# Patient Record
Sex: Male | Born: 1935
Health system: Southern US, Community
[De-identification: ages and names within clinical notes are randomized; demographics above are authoritative.]

## PROBLEM LIST (undated history)

## (undated) DIAGNOSIS — E119 Type 2 diabetes mellitus without complications: Secondary | ICD-10-CM

## (undated) DIAGNOSIS — E785 Hyperlipidemia, unspecified: Secondary | ICD-10-CM

## (undated) DIAGNOSIS — N289 Disorder of kidney and ureter, unspecified: Secondary | ICD-10-CM

## (undated) DIAGNOSIS — K219 Gastro-esophageal reflux disease without esophagitis: Secondary | ICD-10-CM

## (undated) DIAGNOSIS — I10 Essential (primary) hypertension: Secondary | ICD-10-CM

## (undated) DIAGNOSIS — I4891 Unspecified atrial fibrillation: Secondary | ICD-10-CM

## (undated) DIAGNOSIS — E039 Hypothyroidism, unspecified: Secondary | ICD-10-CM

## (undated) HISTORY — DX: Hypothyroidism, unspecified: E03.9

## (undated) HISTORY — DX: Hyperlipidemia, unspecified: E78.5

## (undated) HISTORY — PX: KIDNEY STONE SURGERY: SHX686

## (undated) HISTORY — DX: Essential (primary) hypertension: I10

## (undated) HISTORY — DX: Unspecified atrial fibrillation: I48.91

## (undated) HISTORY — DX: Type 2 diabetes mellitus without complications: E11.9

---

## 2005-03-10 ENCOUNTER — Encounter: Admission: RE | Admit: 2005-03-10 | Discharge: 2005-03-10 | Payer: Self-pay | Admitting: Internal Medicine

## 2005-03-29 ENCOUNTER — Encounter: Admission: RE | Admit: 2005-03-29 | Discharge: 2005-03-29 | Payer: Self-pay | Admitting: Internal Medicine

## 2005-04-05 ENCOUNTER — Encounter: Admission: RE | Admit: 2005-04-05 | Discharge: 2005-04-05 | Payer: Self-pay | Admitting: Internal Medicine

## 2005-08-22 ENCOUNTER — Encounter: Admission: RE | Admit: 2005-08-22 | Discharge: 2005-08-22 | Payer: Self-pay | Admitting: Internal Medicine

## 2006-04-22 ENCOUNTER — Encounter: Admission: RE | Admit: 2006-04-22 | Discharge: 2006-04-22 | Payer: Self-pay | Admitting: Internal Medicine

## 2007-09-28 ENCOUNTER — Inpatient Hospital Stay (HOSPITAL_COMMUNITY): Admission: EM | Admit: 2007-09-28 | Discharge: 2007-09-30 | Payer: Self-pay | Admitting: Emergency Medicine

## 2009-12-30 LAB — PROTIME-INR

## 2010-07-10 HISTORY — PX: FRACTURE SURGERY: SHX138

## 2010-07-27 ENCOUNTER — Encounter: Payer: Self-pay | Admitting: Cardiovascular Disease

## 2010-07-27 DIAGNOSIS — I10 Essential (primary) hypertension: Secondary | ICD-10-CM | POA: Insufficient documentation

## 2010-07-27 DIAGNOSIS — E119 Type 2 diabetes mellitus without complications: Secondary | ICD-10-CM | POA: Insufficient documentation

## 2010-07-27 DIAGNOSIS — E785 Hyperlipidemia, unspecified: Secondary | ICD-10-CM | POA: Insufficient documentation

## 2010-07-27 DIAGNOSIS — I4891 Unspecified atrial fibrillation: Secondary | ICD-10-CM | POA: Insufficient documentation

## 2010-07-27 DIAGNOSIS — E039 Hypothyroidism, unspecified: Secondary | ICD-10-CM | POA: Insufficient documentation

## 2010-08-17 ENCOUNTER — Ambulatory Visit (INDEPENDENT_AMBULATORY_CARE_PROVIDER_SITE_OTHER): Payer: Medicare Other | Admitting: Cardiovascular Disease

## 2010-08-17 ENCOUNTER — Encounter: Payer: Self-pay | Admitting: Cardiovascular Disease

## 2010-08-17 DIAGNOSIS — I10 Essential (primary) hypertension: Secondary | ICD-10-CM

## 2010-08-17 DIAGNOSIS — I4891 Unspecified atrial fibrillation: Secondary | ICD-10-CM

## 2010-08-17 DIAGNOSIS — E039 Hypothyroidism, unspecified: Secondary | ICD-10-CM

## 2010-08-17 NOTE — Progress Notes (Signed)
Patient Active Problem List  Diagnoses  . AF (atrial fibrillation)  . DM (diabetes mellitus)  . Hypertension  . Hyperlipemia  . Hypothyroidism    Current Outpatient Prescriptions on File Prior to Visit  Medication Sig Dispense Refill  . aspirin 325 MG tablet Take 325 mg by mouth daily.        . Dabigatran Etexilate Mesylate (PRADAXA) 150 MG CAPS Take 150 mg by mouth every 12 (twelve) hours.       Marland Kitchen diltiazem (TIAZAC) 240 MG 24 hr capsule Take 240 mg by mouth daily.        . fenofibrate (TRICOR) 48 MG tablet Take 48 mg by mouth daily.        Marland Kitchen glipiZIDE (GLUCOTROL) 10 MG 24 hr tablet Take 10 mg by mouth daily.        Marland Kitchen levothyroxine (SYNTHROID, LEVOTHROID) 75 MCG tablet Take 75 mcg by mouth daily.        Marland Kitchen lisinopril (PRINIVIL,ZESTRIL) 40 MG tablet Take 40 mg by mouth daily.        Marland Kitchen losartan-hydrochlorothiazide (HYZAAR) 100-25 MG per tablet Take 1 tablet by mouth daily.        . Multiple Vitamins-Minerals (CENTRUM SILVER PO) Take by mouth daily.        . pravastatin (PRAVACHOL) 10 MG tablet Take 10 mg by mouth daily.        . Tamsulosin HCl (FLOMAX) 0.4 MG CAPS Take 0.4 mg by mouth daily.          Allergies  Allergen Reactions  . Penicillins Hives    Subjective: Nicolas Snyder presents today for followup of his atrial fibrillation. He complains of being fatigued and not having any appetite. He's lost quite a bit of weight. He was also found have heme-positive stools and was seen by Dr. Osborne Casco.He denies any chest pain or shortness of breath.  Physical exam:  Weight is 173 pounds.  The blood pressure is 142/60.  The heart rate is 72.  The HEENT exam reveals that the sclera are nonicteric.  The mucous membranes are moist.  There are 2+ carotids without bruits.  There is no thyromegaly.  There is no JVD.  The lungs are clear.  .  The heart exam reveals an irregular rate with a normal S1 and S2.  There are no murmurs, gallops, or rubs.  The PMI is not displaced.  The chest wall is non tender.   Abdominal exam reveals good bowel sounds.  There is no guarding or rebound.  There is no hepatosplenomegaly or tenderness.  There are no masses.  Exam of the legs reveal is no clubbing, cyanosis, or edema.  The legs are without rashes.  The distal pulses are intact.  Cranial nerves II - XII are intact.  Motor and sensory functions are intact.  The gait is normal.  A TSH was drawn and is pending.

## 2010-08-17 NOTE — Assessment & Plan Note (Addendum)
His atrial fibrillation is well controlled. He is tolerating the Pradaxa quite well. We will continue with her same medications.I'll see him again in 6 months for follow up visit

## 2010-08-17 NOTE — Assessment & Plan Note (Signed)
His blood pressure is fairly well-controlled although is borderline high today. We'll continue to keep a close eye on this.

## 2010-08-17 NOTE — Assessment & Plan Note (Signed)
He presents with generalized fatigue and weight loss. We'll check a TSH, free T3, free T4 today. We will forward these results to Dr. Osborne Casco.

## 2010-08-18 ENCOUNTER — Other Ambulatory Visit (INDEPENDENT_AMBULATORY_CARE_PROVIDER_SITE_OTHER): Payer: Medicare Other

## 2010-08-18 DIAGNOSIS — I10 Essential (primary) hypertension: Secondary | ICD-10-CM

## 2010-08-18 DIAGNOSIS — E039 Hypothyroidism, unspecified: Secondary | ICD-10-CM

## 2010-08-18 DIAGNOSIS — I4891 Unspecified atrial fibrillation: Secondary | ICD-10-CM

## 2010-09-25 NOTE — Progress Notes (Signed)
Do we need to schedule a tsh for mr. Nicolas Snyder

## 2010-10-04 ENCOUNTER — Emergency Department (HOSPITAL_COMMUNITY): Payer: Medicare Other

## 2010-10-04 ENCOUNTER — Inpatient Hospital Stay (HOSPITAL_COMMUNITY)
Admission: EM | Admit: 2010-10-04 | Discharge: 2010-10-10 | DRG: 480 | Disposition: A | Discharge status: Skilled Nursing Facility | Payer: Medicare Other | Attending: Internal Medicine | Admitting: Internal Medicine

## 2010-10-04 DIAGNOSIS — E876 Hypokalemia: Secondary | ICD-10-CM | POA: Diagnosis not present

## 2010-10-04 DIAGNOSIS — K254 Chronic or unspecified gastric ulcer with hemorrhage: Secondary | ICD-10-CM | POA: Diagnosis present

## 2010-10-04 DIAGNOSIS — T148XXA Other injury of unspecified body region, initial encounter: Secondary | ICD-10-CM

## 2010-10-04 DIAGNOSIS — K264 Chronic or unspecified duodenal ulcer with hemorrhage: Secondary | ICD-10-CM | POA: Diagnosis present

## 2010-10-04 DIAGNOSIS — Z860100 Personal history of colon polyps, unspecified: Secondary | ICD-10-CM

## 2010-10-04 DIAGNOSIS — R791 Abnormal coagulation profile: Secondary | ICD-10-CM | POA: Diagnosis present

## 2010-10-04 DIAGNOSIS — I4891 Unspecified atrial fibrillation: Secondary | ICD-10-CM | POA: Diagnosis present

## 2010-10-04 DIAGNOSIS — W010XXA Fall on same level from slipping, tripping and stumbling without subsequent striking against object, initial encounter: Secondary | ICD-10-CM | POA: Diagnosis present

## 2010-10-04 DIAGNOSIS — S72143A Displaced intertrochanteric fracture of unspecified femur, initial encounter for closed fracture: Principal | ICD-10-CM | POA: Diagnosis present

## 2010-10-04 DIAGNOSIS — N471 Phimosis: Secondary | ICD-10-CM | POA: Diagnosis present

## 2010-10-04 DIAGNOSIS — E785 Hyperlipidemia, unspecified: Secondary | ICD-10-CM | POA: Diagnosis present

## 2010-10-04 DIAGNOSIS — Y92009 Unspecified place in unspecified non-institutional (private) residence as the place of occurrence of the external cause: Secondary | ICD-10-CM

## 2010-10-04 DIAGNOSIS — D62 Acute posthemorrhagic anemia: Secondary | ICD-10-CM | POA: Diagnosis not present

## 2010-10-04 DIAGNOSIS — T45515A Adverse effect of anticoagulants, initial encounter: Secondary | ICD-10-CM | POA: Diagnosis present

## 2010-10-04 DIAGNOSIS — I129 Hypertensive chronic kidney disease with stage 1 through stage 4 chronic kidney disease, or unspecified chronic kidney disease: Secondary | ICD-10-CM | POA: Diagnosis present

## 2010-10-04 DIAGNOSIS — E119 Type 2 diabetes mellitus without complications: Secondary | ICD-10-CM | POA: Diagnosis present

## 2010-10-04 DIAGNOSIS — N183 Chronic kidney disease, stage 3 unspecified: Secondary | ICD-10-CM | POA: Diagnosis present

## 2010-10-04 LAB — APTT: aPTT: 57 seconds — ABNORMAL HIGH (ref 24–37)

## 2010-10-04 LAB — COMPREHENSIVE METABOLIC PANEL
ALT: 12 U/L (ref 0–53)
AST: 14 U/L (ref 0–37)
Albumin: 3.4 g/dL — ABNORMAL LOW (ref 3.5–5.2)
Alkaline Phosphatase: 20 U/L — ABNORMAL LOW (ref 39–117)
CO2: 28 mEq/L (ref 19–32)
Chloride: 101 mEq/L (ref 96–112)
Creatinine, Ser: 2.44 mg/dL — ABNORMAL HIGH (ref 0.4–1.5)
GFR calc Af Amer: 31 mL/min — ABNORMAL LOW (ref >60)
GFR calc non Af Amer: 25 mL/min — ABNORMAL LOW (ref >60)
Potassium: 3.4 mEq/L — ABNORMAL LOW (ref 3.5–5.1)
Sodium: 139 mEq/L (ref 135–145)
Total Bilirubin: 0.7 mg/dL (ref 0.3–1.2)

## 2010-10-04 LAB — DIFFERENTIAL
Basophils Relative: 0 % (ref 0–1)
Eosinophils Relative: 1 % (ref 0–5)
Lymphs Abs: 0.7 10*3/uL (ref 0.7–4.0)
Monocytes Absolute: 0.7 10*3/uL (ref 0.1–1.0)
Monocytes Relative: 4 % (ref 3–12)

## 2010-10-04 LAB — URINALYSIS, ROUTINE W REFLEX MICROSCOPIC
Nitrite: NEGATIVE
Specific Gravity, Urine: 1.019 (ref 1.005–1.030)
Urobilinogen, UA: 0.2 mg/dL (ref 0.0–1.0)
pH: 5.5 (ref 5.0–8.0)

## 2010-10-04 LAB — CBC
Platelets: 236 10*3/uL (ref 150–400)
RBC: 3.38 MIL/uL — ABNORMAL LOW (ref 4.22–5.81)
RDW: 13 % (ref 11.5–15.5)
WBC: 16.9 10*3/uL — ABNORMAL HIGH (ref 4.0–10.5)

## 2010-10-04 LAB — SAMPLE TO BLOOD BANK

## 2010-10-04 LAB — OCCULT BLOOD, POC DEVICE: Fecal Occult Bld: POSITIVE

## 2010-10-05 ENCOUNTER — Inpatient Hospital Stay (HOSPITAL_COMMUNITY): Payer: Medicare Other

## 2010-10-05 LAB — GLUCOSE, CAPILLARY
Glucose-Capillary: 170 mg/dL — ABNORMAL HIGH (ref 70–99)
Glucose-Capillary: 97 mg/dL (ref 70–99)
Glucose-Capillary: 103 mg/dL — ABNORMAL HIGH (ref 70–99)

## 2010-10-05 LAB — BASIC METABOLIC PANEL
BUN: 82 mg/dL — ABNORMAL HIGH (ref 6–23)
Chloride: 104 mEq/L (ref 96–112)
Chloride: 106 mEq/L (ref 96–112)
GFR calc Af Amer: 32 mL/min — ABNORMAL LOW (ref >60)
GFR calc non Af Amer: 27 mL/min — ABNORMAL LOW (ref >60)
Glucose, Bld: 134 mg/dL — ABNORMAL HIGH (ref 70–99)
Potassium: 3.4 mEq/L — ABNORMAL LOW (ref 3.5–5.1)
Potassium: 3.5 mEq/L (ref 3.5–5.1)
Sodium: 141 mEq/L (ref 135–145)

## 2010-10-05 LAB — CBC
HCT: 20.7 % — ABNORMAL LOW (ref 39.0–52.0)
HCT: 26.2 % — ABNORMAL LOW (ref 39.0–52.0)
Hemoglobin: 6.7 g/dL — CL (ref 13.0–17.0)
Hemoglobin: 8.5 g/dL — ABNORMAL LOW (ref 13.0–17.0)
MCH: 29.6 pg (ref 26.0–34.0)
MCH: 30.4 pg (ref 26.0–34.0)
MCH: 30.5 pg (ref 26.0–34.0)
MCHC: 32.4 g/dL (ref 30.0–36.0)
MCHC: 33.6 g/dL (ref 30.0–36.0)
MCV: 89.4 fL (ref 78.0–100.0)
Platelets: 197 10*3/uL (ref 150–400)
RBC: 2.93 MIL/uL — ABNORMAL LOW (ref 4.22–5.81)
RDW: 13.1 % (ref 11.5–15.5)
RDW: 14.5 % (ref 11.5–15.5)
WBC: 10.3 10*3/uL (ref 4.0–10.5)

## 2010-10-05 LAB — DIFFERENTIAL
Basophils Absolute: 0 10*3/uL (ref 0.0–0.1)
Eosinophils Absolute: 0 10*3/uL (ref 0.0–0.7)
Lymphs Abs: 0.8 10*3/uL (ref 0.7–4.0)
Monocytes Absolute: 0.8 10*3/uL (ref 0.1–1.0)
Neutro Abs: 12.3 10*3/uL — ABNORMAL HIGH (ref 1.7–7.7)

## 2010-10-05 LAB — APTT: aPTT: 68 seconds — ABNORMAL HIGH (ref 24–37)

## 2010-10-05 LAB — CARDIAC PANEL(CRET KIN+CKTOT+MB+TROPI)
CK, MB: 1.2 ng/mL (ref 0.3–4.0)
CK, MB: 1.4 ng/mL (ref 0.3–4.0)
Relative Index: INVALID (ref 0.0–2.5)
Troponin I: 0.02 ng/mL (ref 0.00–0.06)

## 2010-10-05 LAB — URINE CULTURE

## 2010-10-05 LAB — HEMOGLOBIN A1C: Hgb A1c MFr Bld: 6.8 % — ABNORMAL HIGH (ref <5.7)

## 2010-10-06 LAB — GLUCOSE, CAPILLARY: Glucose-Capillary: 146 mg/dL — ABNORMAL HIGH (ref 70–99)

## 2010-10-06 LAB — CBC
HCT: 25.7 % — ABNORMAL LOW (ref 39.0–52.0)
HCT: 25.8 % — ABNORMAL LOW (ref 39.0–52.0)
Hemoglobin: 8.6 g/dL — ABNORMAL LOW (ref 13.0–17.0)
Hemoglobin: 8.7 g/dL — ABNORMAL LOW (ref 13.0–17.0)
RBC: 2.87 MIL/uL — ABNORMAL LOW (ref 4.22–5.81)
RDW: 14.6 % (ref 11.5–15.5)
WBC: 10.4 10*3/uL (ref 4.0–10.5)
WBC: 10.5 10*3/uL (ref 4.0–10.5)

## 2010-10-06 LAB — BRAIN NATRIURETIC PEPTIDE: Pro B Natriuretic peptide (BNP): 125 pg/mL — ABNORMAL HIGH (ref 0.0–100.0)

## 2010-10-06 LAB — PROTIME-INR: INR: 1.53 — ABNORMAL HIGH (ref 0.00–1.49)

## 2010-10-06 LAB — BASIC METABOLIC PANEL
BUN: 68 mg/dL — ABNORMAL HIGH (ref 6–23)
GFR calc non Af Amer: 27 mL/min — ABNORMAL LOW (ref >60)
Potassium: 3.8 mEq/L (ref 3.5–5.1)

## 2010-10-06 LAB — PREPARE FRESH FROZEN PLASMA
Unit division: 0
Unit division: 0

## 2010-10-06 LAB — HEMOGLOBIN AND HEMATOCRIT, BLOOD
HCT: 26.7 % — ABNORMAL LOW (ref 39.0–52.0)
Hemoglobin: 8.7 g/dL — ABNORMAL LOW (ref 13.0–17.0)

## 2010-10-06 LAB — APTT: aPTT: 64 seconds — ABNORMAL HIGH (ref 24–37)

## 2010-10-07 ENCOUNTER — Inpatient Hospital Stay (HOSPITAL_COMMUNITY): Payer: Medicare Other

## 2010-10-07 DIAGNOSIS — I4891 Unspecified atrial fibrillation: Secondary | ICD-10-CM

## 2010-10-07 LAB — PREPARE RBC (CROSSMATCH)

## 2010-10-07 LAB — URINALYSIS, ROUTINE W REFLEX MICROSCOPIC
Protein, ur: NEGATIVE mg/dL
Urobilinogen, UA: 0.2 mg/dL (ref 0.0–1.0)

## 2010-10-07 LAB — BASIC METABOLIC PANEL
BUN: 51 mg/dL — ABNORMAL HIGH (ref 6–23)
CO2: 29 mEq/L (ref 19–32)
Chloride: 108 mEq/L (ref 96–112)
Glucose, Bld: 128 mg/dL — ABNORMAL HIGH (ref 70–99)
Potassium: 3.7 mEq/L (ref 3.5–5.1)

## 2010-10-07 LAB — GLUCOSE, CAPILLARY
Glucose-Capillary: 138 mg/dL — ABNORMAL HIGH (ref 70–99)
Glucose-Capillary: 145 mg/dL — ABNORMAL HIGH (ref 70–99)

## 2010-10-07 LAB — PROTIME-INR
INR: 1.36 (ref 0.00–1.49)
Prothrombin Time: 17 seconds — ABNORMAL HIGH (ref 11.6–15.2)

## 2010-10-07 LAB — CBC
MCV: 91.3 fL (ref 78.0–100.0)
Platelets: 156 10*3/uL (ref 150–400)
RBC: 3 MIL/uL — ABNORMAL LOW (ref 4.22–5.81)
WBC: 10.2 10*3/uL (ref 4.0–10.5)

## 2010-10-07 LAB — APTT: aPTT: 67 seconds — ABNORMAL HIGH (ref 24–37)

## 2010-10-07 LAB — URINE MICROSCOPIC-ADD ON

## 2010-10-08 LAB — CROSSMATCH
ABO/RH(D): A POS
Unit division: 0

## 2010-10-08 LAB — GLUCOSE, CAPILLARY
Glucose-Capillary: 136 mg/dL — ABNORMAL HIGH (ref 70–99)
Glucose-Capillary: 143 mg/dL — ABNORMAL HIGH (ref 70–99)
Glucose-Capillary: 147 mg/dL — ABNORMAL HIGH (ref 70–99)
Glucose-Capillary: 148 mg/dL — ABNORMAL HIGH (ref 70–99)

## 2010-10-08 LAB — BASIC METABOLIC PANEL
CO2: 26 mEq/L (ref 19–32)
Chloride: 110 mEq/L (ref 96–112)
GFR calc Af Amer: 38 mL/min — ABNORMAL LOW (ref >60)
Potassium: 3.9 mEq/L (ref 3.5–5.1)
Sodium: 144 mEq/L (ref 135–145)

## 2010-10-08 LAB — CBC
Hemoglobin: 8.3 g/dL — ABNORMAL LOW (ref 13.0–17.0)
MCH: 29.6 pg (ref 26.0–34.0)
RBC: 2.8 MIL/uL — ABNORMAL LOW (ref 4.22–5.81)
WBC: 10.2 10*3/uL (ref 4.0–10.5)

## 2010-10-08 NOTE — H&P (Signed)
NAME:  Nicolas Snyder, Nicolas Snyder NO.:  [id number]  MEDICAL RECORD NO.:  [id number]           PATIENT TYPE:  I  LOCATION:  [id number]                         FACILITY:  [facility]  PHYSICIAN:  [name], MD       DATE OF BIRTH:  [birth year]  DATE OF ADMISSION:  10/04/2010 DATE OF DISCHARGE:                             HISTORY & PHYSICAL   PRIMARY CARE PROVIDER:  [name], M.D.  GASTROENTEROLOGIST:  Earle Gell, M.D.  CARDIOLOGIST:  Thayer Headings, M.D.  CHIEF COMPLAINT:  Left hip fracture and multiple medical problems.  HISTORY OF PRESENT ILLNESS:  An 77 year old male with multiple medical problems who was making his wife's food tonight when he moved around the table and fell and broke his left hip.  He had a syncopal event and question whether he passed out.  He did hit his head.  He hit his left elbow.  He felt bad all day.  He had anorexia, did not eat breakfast or lunch, had Ensure at about 3 p.m.  He describes recent positive abdominal pain, recent rectal bleeding.  Dr. Osborne Casco wanted him to have a 4th colonoscopy where he has had a history of multiple polyps in the past.  Two other colonoscopies were done in [place], [place], and one has been done here with Dr. Wynetta Emery and one of the things he reports is having about 11 polyps.  Because he could not move and get up, EMS was called, he was brought to the emergency department.  In the ambulance, he vomited coffee-ground emesis.  In the ER, workup showed the left hip fracture and I was called for inpatient admission.  He has been given IV Zofran, fentanyl, immobilization, and fluids.  Of note, the patient does take care of his wife with Alzheimer disease.  PAST MEDICAL HISTORY: 1. AFib, on Pradaxa. 2. Hypertension. 3. Diabetes mellitus type 2. 4. Nephrolithiasis, status post open removal of stones in 2005. 5. History of partial small bowel obstruction in May 2009, resolved     with conservative treatment. 6.  Hypothyroidism. 7. Left cataract repair and blindness in left eye. 8. Chronic kidney disease with baseline creatinine that I found in     2009 is 1.8. 9. History of multiple polyps.  MEDICATIONS: 1. Pradaxa for at least a year and a half. 2. Glipizide 10 daily. 3. Synthroid 0.075 daily. 4. Diltiazem 240 daily. 5. Pravastatin 10 daily. 6. Losartan HCT 125 daily. 7. Lisinopril 40 daily. 8. TriCor 48 daily. 9. Flomax 0.4 daily. 10.Aspirin 81 daily.  FAMILY HISTORY:  Stroke and pancreatic cancer.  SOCIAL HISTORY:  Married.  Retired Firefighter.  Quit tobacco in 1961.  REVIEW OF SYSTEMS:  No current dizziness, chest pain, shortness of breath.  Full review of systems was obtained and negative.  The patient is a very good historian and does not remember any precipitating event, really does not remember the event itself.  PHYSICAL EXAMINATION:  VITAL SIGNS:  Temperature 97.8, heart rate 87, blood pressure 136/78, saturating 96% on room air. GENERAL:  Alert and oriented.  He smells of a GI bleed.  PULMONARY:  Clear to auscultation bilaterally. CARDIAC:  Irregularly irregular. RECTAL:  Heme positive. ABDOMEN:  Soft, nontender, nondistended.  Bowel sounds are positive. Oropharynx is dry. NECK:  No JVD.  ANCILLARY DATA:  Cranial CT shows mild atrophy, nothing acute.  CT spine shows osteopenia without fractures, severe multilevel osteoarthritis. Left hip x-ray shows comminuted left intertrochanteric femur fracture. Chest x-ray shows cardiomegaly without interstitial changes.  The occult blood was positive.  White count 16.9, platelet count 236, hemoglobin 10.1, PTT 57, INR 2.53. Urinalysis is negative.  Sodium 139, potassium 3.4, chloride 101, bicarb 28, BUN 70, creatinine 2.44, albumin 3.4.  EKG shows AFib, heart rate 87.  ASSESSMENT:  This is an elderly man with multiple medical problems, being admitted to Dr. Loren Racer service for syncope, GI bleed, and left hip  fracture.  PLAN: 1. Step down for supportive care, seial Hemoglobins, and CK/TI's. 2. INR may be a false positive, but with the GI bleed while on the Pradaxa, we will give     FFP and vitamin K. 3. We will consult GI.  I have already talked with Dr. Paulita Fujita who is     on call for Dr. Wynetta Emery and, if needed, bring him in tonight for     the EGD, but with coffee-ground emesis and no hematemesis, can be     set up for the morning.  We will need to do an EGD. 4. Low threshold for NG tube, but at this point since he is     hemodynamically stable and not vomiting bright red blood, I do not     see the benefit of it, but will keep it in mind. 5. Hold aspirin and Pradaxa at this current time and hold most of the     oral medications at this point. 6. IV fluids, but watch for volume overload. 7. Renal insufficiency.  He is off his baseline.  His BUN is elevated,     probably from the GI bleed, though. 8. Cardiology has been consulted. 9. Ortho will be consulted. 10.Sip and chips only for now and n.p.o. post midnight except for     meds. 11.We will try to keep rate controlled. 12.Squeezers for DVT prophylaxis. 13.Pain control. 14.Blood pressure is currently fine. 15.After GI and cardiology workup, he will need to go for an ORIF     surgery. 16.We will check TSH and A1c. 17.Insulin sliding scale. 18.He can easily get orthostatic and syncopal due to dehydration from     his multiple medications and the worsening renal failure and the     medications which could have led to his syncope on top on the GI     bleed include diltiazem, the losartan HCT, and the lisinopril on     top of the Flomax. These have all been stopped at this current time     and need to rethought about as he improves.     [name], MD     JMR/MEDQ  D:  10/04/2010  T:  10/05/2010  Job:  [id number]  Electronically Signed by Shon Baton MD on 10/08/2010 12:50:40 PM

## 2010-10-09 LAB — GLUCOSE, CAPILLARY
Glucose-Capillary: 123 mg/dL — ABNORMAL HIGH (ref 70–99)
Glucose-Capillary: 136 mg/dL — ABNORMAL HIGH (ref 70–99)
Glucose-Capillary: 148 mg/dL — ABNORMAL HIGH (ref 70–99)
Glucose-Capillary: 155 mg/dL — ABNORMAL HIGH (ref 70–99)

## 2010-10-09 LAB — BASIC METABOLIC PANEL
CO2: 27 mEq/L (ref 19–32)
Calcium: 8.3 mg/dL — ABNORMAL LOW (ref 8.4–10.5)
Chloride: 110 mEq/L (ref 96–112)
GFR calc Af Amer: 35 mL/min — ABNORMAL LOW (ref >60)
Sodium: 142 mEq/L (ref 135–145)

## 2010-10-09 LAB — CBC
Hemoglobin: 8.2 g/dL — ABNORMAL LOW (ref 13.0–17.0)
MCHC: 32 g/dL (ref 30.0–36.0)
Platelets: 223 10*3/uL (ref 150–400)
RBC: 2.76 MIL/uL — ABNORMAL LOW (ref 4.22–5.81)

## 2010-10-10 LAB — CBC
HCT: 24.4 % — ABNORMAL LOW (ref 39.0–52.0)
Hemoglobin: 7.9 g/dL — ABNORMAL LOW (ref 13.0–17.0)
MCH: 29.8 pg (ref 26.0–34.0)
MCHC: 32.4 g/dL (ref 30.0–36.0)
MCV: 92.1 fL (ref 78.0–100.0)
Platelets: 236 K/uL (ref 150–400)
RBC: 2.65 MIL/uL — ABNORMAL LOW (ref 4.22–5.81)
RDW: 14.5 % (ref 11.5–15.5)
WBC: 10.1 K/uL (ref 4.0–10.5)

## 2010-10-10 LAB — GLUCOSE, CAPILLARY
Glucose-Capillary: 141 mg/dL — ABNORMAL HIGH (ref 70–99)
Glucose-Capillary: 141 mg/dL — ABNORMAL HIGH (ref 70–99)
Glucose-Capillary: 116 mg/dL — ABNORMAL HIGH (ref 70–99)
Glucose-Capillary: 140 mg/dL — ABNORMAL HIGH (ref 70–99)
Glucose-Capillary: 153 mg/dL — ABNORMAL HIGH (ref 70–99)
Glucose-Capillary: 160 mg/dL — ABNORMAL HIGH (ref 70–99)

## 2010-10-10 LAB — BASIC METABOLIC PANEL WITH GFR
BUN: 34 mg/dL — ABNORMAL HIGH (ref 6–23)
CO2: 25 meq/L (ref 19–32)
Calcium: 8 mg/dL — ABNORMAL LOW (ref 8.4–10.5)
Chloride: 110 meq/L (ref 96–112)
Creatinine, Ser: 2.1 mg/dL — ABNORMAL HIGH (ref 0.4–1.5)
GFR calc Af Amer: 36 mL/min — ABNORMAL LOW (ref >60)
GFR calc non Af Amer: 30 mL/min — ABNORMAL LOW (ref >60)
Glucose, Bld: 135 mg/dL — ABNORMAL HIGH (ref 70–99)
Potassium: 4 meq/L (ref 3.5–5.1)
Sodium: 140 meq/L (ref 135–145)

## 2010-10-10 LAB — TRANSFUSION REACTION: Post RXN DAT IgG: NEGATIVE

## 2010-10-10 NOTE — Op Note (Signed)
NAME:  Nicolas Snyder, Nicolas Snyder                 ACCOUNT NO.:  [id number]  MEDICAL RECORD NO.:  [id number]           PATIENT TYPE:  I  LOCATION:  [id number]                         [facility]ACILITY:  [facility]  PHYSICIAN:  Pietro Cassis. Alvan Dame, M.D.  DATE OF BIRTH:  [birth year]  DATE OF PROCEDURE:  10/07/2010 DATE OF DISCHARGE:                              OPERATIVE REPORT   PREOPERATIVE DIAGNOSIS:  Comminuted left intertrochanteric femur fracture.  POSTOPERATIVE DIAGNOSIS:  Comminuted left intertrochanteric femur fracture.  PROCEDURE:  Open reduction and internal fixation of left hip fracture utilizing DePuy troch nail 11 x 200 mm with a 115 mm lag screw and distal interlock.  SURGEON:  Pietro Cassis. Alvan Dame, MD.  ASSISTANT:  Surgical team.  ANESTHESIA:  General.  ESTIMATED BLOOD LOSS:  Approximately 100 cc or less.  DRAINS:  None.  COMPLICATIONS:  None.  SPECIMENS:  None.  INDICATIONS FOR PROCEDURE:  Mr. Nicolas Snyder is an 77 year old male with multiple medical problems admitted to the hospital on October 04, 2010, after a fall.  He had complaints of left hip pain and radiographs revealed a left hip fracture.  Once stabilized and cleared from a medical standpoint, he was set up for surgery for open reduction and internal fixation and stabilization of the fracture to allow for mobility and ambulation.  Consent was obtained after reviewing the risks and benefits and the necessity of the operation.  PROCEDURE IN DETAIL:  The patient was brought to the operative theater. Once adequate anesthesia and preoperative antibiotics, clindamycin administered.  He was positioned supine on the fracture table.  His right lower extremity was flexed and abducted out of the way with bony prominences padded.  The left foot was placed into the into the leg holder.  Traction and internal rotation applied.  Once positioned adequately, fluoroscopy was used to confirm fracture reduction.  A time- out was performed identifying the patient,  planned procedure and extremity.  The left lower extremity was then prepped and draped in a sterile fashion from the iliac crest down close to the knee with shower curtain technique.  The fluoroscopy was brought back to the field.  Landmarks identified and incision was made proximal to the trochanter laterally.  Guidewire was then inserted into the tip of the trochanter and then passed in the proximal femur.  Once this was confirmed, the proximal femur was drilled and the nail passed by hand to its appropriate depth.  Once it was then positioned, the guide for the guidewire was positioned and lateral incision made.  Guidewire was then inserted into the center of the femoral head in AP and lateral planes.  I measured the depth of this and chose a 150 mm lag screw and drilled this for the lag screw and then passed the lag screw by hand.  At this point, traction was taken off and compression was applied across the fracture site, medialized in the shaft of the fracture.  At this point, I locked down the proximal screw and then backed off a quarter of a turn to allow for compression.  The distal interlock was placed off the jig measuring 36 mm.  At this point, the jig was removed.  Final radiographs were obtained in AP and lateral planes.  The hip wounds were irrigated and the proximal wound was closed in layers with a #1 Vicryl and gluteal fascia.  The remaining wounds were closed with 2-0 Vicryl and staples on the skin. Skin was cleaned, dried and dressed sterilely using Mepilex dressing. The patient was then awoken from anesthesia, transferred to the hospital bed and brought to the recovery room in stable condition tolerating the procedure well.     Pietro Cassis Alvan Dame, M.D.     MDO/MEDQ  D:  10/07/2010  T:  10/08/2010  Job:  [id number]  Electronically Signed by Paralee Cancel M.D. on 10/10/2010 12:15:45 PM

## 2010-10-11 LAB — CROSSMATCH
ABO/RH(D): A POS
Unit division: 0

## 2010-10-11 NOTE — Discharge Summary (Signed)
NAME:  Nicolas Snyder, Nicolas Snyder                 ACCOUNT NO.:  [id number]  MEDICAL RECORD NO.:  [id number]           PATIENT TYPE:  I  LOCATION:  [id number]                         FACILITY:  [facility]  PHYSICIAN:  Haywood Pao, M.D.DATE OF BIRTH:  1920/09/08  DATE OF ADMISSION:  10/04/2010 DATE OF DISCHARGE:  10/10/2010                              DISCHARGE SUMMARY   FINAL DIAGNOSES: 1. Upper gastrointestinal bleed secondary to ulcer. 2. Atrial fibrillation, chronic Pradaxa, now discontinued secondary to     above. 3. Anemia requiring transfusion. 4. Left hip fracture status post open reduction and internal fixation. 5. Hypertension. 6. Hyperlipidemia. 7. Hypothyroidism. 8. Diabetes mellitus type 2, mild. 9. History of nephrolithiasis. 10.History of cataract repair. 11.Chronic kidney disease, stage III. 12.History of multiple colon polyps.  ALLERGIES:  ASPIRIN and PRADAXA.  DISCHARGE MEDICATIONS: 1. Tylenol 325 mg 1 to 2 tablets every 6 hours as needed for pain. 2. Diltiazem 360 mg p.o. daily.  This is an increase from his 240. 3. Iron sulfate 325 mg p.o. b.i.d. 4. Hydrocodone/APAP 5/325 one to two tablets p.o. q.4 h as needed for     pain. 5. Robaxin 500 mg p.o. q.6 h as needed for pain/spasm. 6. Zofran 4 mg IV every 6 hours as needed for nausea. 7. Protonix 40 mg p.o. daily, to be continued at least 1 month. 8. Flomax 0.4 mg p.o. q.h.s. 9. Glipizide 10 mg p.o. daily. 10.Hyzaar 100/25 one tablet p.o. daily. 11.Multivitamin 1 tablet daily. 12.Pravastatin 10 mg p.o. daily. 13.Synthroid 75 mcg p.o. daily. 14.Fenofibrate 48 mg 1 tablet p.o. daily.  Medications which have been discontinued are: 1. Lisinopril 40 mg daily. 2. Aspirin 81 mg daily. 3. Pradaxa 150 mg twice daily.  His diltiazem dose has been increased from 240 to 360.  DISCHARGE PHYSICAL EXAM:  VITAL SIGNS:  Blood pressure 136/73, pulse 73, respiratory rate 16, satting 92% on room air. GENERAL:  No acute  distress. HEENT:  Normocephalic, atraumatic.  PERRLA, EOMI.  ENT within normal limits. NECK:  Supple.  No lymphadenopathy, JVD or bruit. HEART:  Irregularly irregular consistent with atrial fibrillation. LUNGS:  Clear to auscultation bilaterally. ABDOMEN:  Soft, nontender, normoactive bowel sounds. EXTREMITIES:  The patient has appropriate dressing of the left hip and has a slight decrease range of motion secondary to pain.  He has been able to weightbear on this with limited mobility.  There is no erythema or exudate from the surgical incision.  He has trace edema in his lower extremities which is chronic for him. NEURO:  The patient is oriented to person, place and time and does not have any lateralizing deficits.  SUMMARY OF HOSPITAL COURSE:  [name] is a very unfortunate gentleman of mine who is 77 years old.  He has had some issues with heme-positive stools in the past but never has had an overt anemia with this.  On the date of admission, he stood up and fell over because he got dizzy and came to the emergency room and was found to have a hemoglobin of 6 with active GI bleed.  Due to his fall, he actually fractured  his left hip as well.  The patient was taken off his Pradaxa and given fresh frozen plasma and total of 4 units of packed red blood cells which was able to stabilize his hemoglobin and hematocrit in the mid 8s.  He has remained this way.  Consultation per GI led to an EGD within 24 hours that saw an area of ulceration in the stomach that was 1 cm in size.  They did not perform brushings and no intervention was needed as the area was not actively bleeding and has not actively bled since he has completed his transfusion with FFP.  The patient was then stabilized from a hemodynamic standpoint and underwent hip surgery on the following Friday which was successful with a left open reduction and internal fixation. He has remained in bed for most of the weekend due to pain  but has been able to stand up at the edge of bed with assistance.  The overall plan for him is to go to [facility] to get rehab so that he may eventually return home.  His family is going to be taking care of his wife, who he is normally caretaker of due to her dementia.  I spoke at length with the patient's son and they are happy with the plans for being at [facility] as this is convenient location to both parties.  He has a pending transfer there on the date of his discharge.  FOLLOWUP:  The patient is to follow up with Dr. Alvan Dame at [facility] in 2 weeks.  He is to follow up with me 1 week following from leaving [place].  He will be followed by my partners at that facility.  He needs to undergo physical therapy and rehab to the point where he can return home and once again be care giving for his wife.  Allergies as aspirin and Pradaxa.  So he does not receive any antiplatelet, anticoagulation.  He understands this puts him at high risk because of atrial fibrillation but due to the amount of bleeding he had, he needs to be off these medications for least 1 month with PPI.  We will reinstitute aspirin as his atrial fibrillation prophylaxis once he is fully healed.  CONDITION ON DISCHARGE:  Stable.     Haywood Pao, M.D.     RWT/MEDQ  D:  10/10/2010  T:  10/10/2010  Job:  [id number]  Electronically Signed by Domenick Gong M.D. on 10/11/2010 04:59:41 PM

## 2010-10-11 NOTE — Op Note (Signed)
  NAME:  OVID, TERRY NO.:  [id number]  MEDICAL RECORD NO.:  [id number]           PATIENT TYPE:  I  LOCATION:  [id number]                         FACILITY:  [facility]  PHYSICIAN:  Wonda Horner, M.D.   DATE OF BIRTH:  [birth year]  DATE OF PROCEDURE:  10/05/2010 DATE OF DISCHARGE:                              OPERATIVE REPORT   PROCEDURE:  Esophagogastroduodenoscopy.  INDICATIONS FOR PROCEDURE:  Coffee-ground emesis.  Informed consent was obtained after explanation of the risks of bleeding, infection and perforation.  PREMEDICATIONS: 1. Fentanyl 37.5 mcg IV. 2. Versed 3 mg IV.  DESCRIPTION OF PROCEDURE:  With the patient in the left lateral decubitus position, the Pentax gastroscope was inserted into the oropharynx and passed into the esophagus.  It was advanced down the esophagus, then into the stomach and into the duodenum.  The second portion of the duodenum looked normal.  In the bulb of the duodenum, there was a 1-cm clean based duodenal bulb ulcer.  In the pyloric channel, there was a smaller ulcer as well.  The stomach was otherwise normal including retroflexion of the fundus and cardia.  The esophagus looked normal.  He tolerated the procedure well without complications.  IMPRESSION: 1. A 1-cm duodenal bulb ulcer. 2. Small pyloric channel ulcer.  There is no evidence of active bleeding.  I would recommend avoidance of aspirin and NSAIDS.  We would also recommend a proton pump inhibitor therapy for this patient.  A biopsy for H pylori was not obtained due to coagulopathy.  I would recommend checking an H pylori antibody.          ______________________________ Wonda Horner, M.D.     SFG/MEDQ  D:  10/06/2010  T:  10/06/2010  Job:  [id number]  cc:   [name], MD Fax: [phone]  Electronically Signed by Anson Fret MD on 10/11/2010 04:39:44 PM

## 2010-10-30 NOTE — Consult Note (Signed)
NAME:  Nicolas Snyder, Nicolas Snyder                 ACCOUNT NO.:  [id number]  MEDICAL RECORD NO.:  [id number]           PATIENT TYPE:  I  LOCATION:  [id number]                         FACILITY:  [facility]  PHYSICIAN:  Lear Ng, MDDATE OF BIRTH:  October 01, 1920  DATE OF CONSULTATION: DATE OF DISCHARGE:                                CONSULTATION   REASON FOR CONSULTATION:  GI bleed.  HISTORY OF PRESENT ILLNESS:  Mr. Kerkhoff is a pleasant 77 year old male who had a syncopal episode which led to the fall and he broke his left hip. When the ambulance arrived, he reportedly vomited up coffee-ground emesis.  He reports that he drank chocolate milk Ensure and he says that is what he vomited up.  He denies vomiting up any other coffee-ground emesis and denies any other vomiting or nausea and denies any upper abdominal pain.  He says he has had lower abdominal pain for 2 years but denies any increase in that.  Denies any melena or hematochezia.  He does have a history of colon polyps and his last colonoscopy was in 2006 reportedly by Dr. Wynetta Emery.  He said he has had polyps on all 3 colonoscopies he has had, [place]  He denies any NSAIDs except for a baby aspirin each day.  His hemoglobin was 10.1 on admission and dropped to 8.5 last night and currently is 6.7.  He has had no further coffee-ground emesis and he has not had any melena.  He is on Pradaxa at home for atrial fibrillation and that has been held.  PAST MEDICAL HISTORY:  Diabetes, hypertension, atrial fibrillation, history of kidney stones, history of partial small-bowel obstruction, hypothyroidism, chronic kidney disease, history colon polyps as stated above.  MEDICATIONS ON ADMISSION:  Pradaxa, glipizide, Synthroid, diltiazem, pravastatin, losartan, lisinopril, TriCor, Flomax, aspirin.  Doses listed in admission H and P.  FAMILY HISTORY:  Noncontributory.  SOCIAL HISTORY:  Married.  Former smoker.  ALLERGIES:   PENICILLIN.  REVIEW OF SYSTEMS:  Negative from GI standpoint, except stated above.  PHYSICAL EXAMINATION:  VITAL SIGNS:  Temperature 98.6, pulse irregular in low 100s, blood pressure 125/69. GENERAL:  Alert, no acute distress, complaining of left hip pain. ABDOMEN:  Soft, nontender, nondistended.  Active bowel sounds.  LABORATORY DATA:  White blood count 10.4, hemoglobin 6.7, MCV 91, platelet count 174, INR 2.03, BUN 82, creatinine 2.3.  Fecal occult blood test positive.  All other labs reviewed and are in the hospital record.  IMPRESSION AND RECOMMENDATIONS:  The patient is an 77 year old pleasant white male who had a syncopal episode resulting in fall and left hip fracture and developed one episode of reported coffee-ground emesis. His hemoglobin is down to 6.7 and could be related to his recent fall but with him being on Pradaxa and having elevated INR and his reported history of coffee-ground emesis, he will have an upper endoscopy done today by Dr. Penelope Coop to look for an upper GI source.  If the upper endoscopy is negative, I would recommend a repeat colonoscopy at this time and do conservative therapy.  The patient will be  n.p.o. except medicines.     Lear Ng, MD     VCS/MEDQ  D:  10/05/2010  T:  10/05/2010  Job:  [id number]  cc:   Haywood Pao, M.D. [phone]  Thayer Headings, M.D. Fax: [phone]  [name], MD Fax: [phone]  Earle Gell, M.D. [phone]  Wonda Horner, M.D. [phone]  Electronically Signed by Wilford Corner MD on 10/30/2010 11:25:55 AM

## 2010-11-03 NOTE — H&P (Signed)
NAME:  Nicolas Snyder, Nicolas Snyder NO.:  [id number]  MEDICAL RECORD NO.:  [id number]           PATIENT TYPE:  I  LOCATION:  [id number]                         FACILITY:  [facility]  PHYSICIAN:  Cynda Familia, M.D.DATE OF BIRTH:  [birth year]  DATE OF ADMISSION:  10/04/2010 DATE OF DISCHARGE:                             HISTORY & PHYSICAL   CHIEF COMPLAINT:  Fracture left hip.  HISTORY OF PRESENT ILLNESS:  The patient is a 77 year old gentleman who lives at home caring for his wife with Alzheimer's who on the 27th had a fall possibly secondary to a syncopal episode.  The patient had been feeling poorly over the previous day.  He had been anorexic, he had not eaten anything that day.  He fell landing down hitting his head, his upper extremities, and his left hip.  The patient required EMS to bring to the hospital.  Evaluation showed he had a left hip fracture intertrochanteric.  He was also found to have a significant GI bleed with a history of atrial fibrillation, on Pradaxa.  The patient also had a cardiac and GI consults.  He was admitted under medical services.  His medical history is reviewed.  His chart was reviewed.  MEDICAL HISTORY:  Includes atrial fib, on Protonix; hypertension; diabetes; hypothyroid; chronic renal insufficiency; history of small bowel obstruction with colon polyps.  MEDICATIONS:  Include Pradaxa, glipizide, Synthroid, diltiazem, pravastatin, losartan, HCT, lisinopril, TriCor, Flomax and aspirin.  ALLERGIES:  He is allergic to PENICILLIN.  SOCIAL HISTORY:  Includes he is married.  Lives at home and attends to his wife with Alzheimer's.  PHYSICAL EXAM:  The patient is conscious, alert, appropriate, pale gentleman; appears to be fairly comfortable in a hospital bed gurney, just coming back from the GI endoscopy suite.  He denies any significant pain other than that present in his left lower extremity.  He has got good range of motion of his upper  extremities with multiple ecchymotic areas throughout.  He has no limitations with shoulders, elbows and wrists.  He has no deformities.  His lower extremities, the right lower extremity has got good motion of the hip, knee and ankle without any discomfort or palpable deformities.  He is neurologically and vascularly intact in the foot.  His left lower extremity, he is painful with any types of motion with gentle rocking of the hip.  He has got no palpable deformities to the knee or the lower ankle and foot region.  He is in 5 pounds Buck traction.  He has got no palpable dorsalis pedis pulse but he does have a posterior tibial pulse which is bounding.  He got good sensation.  Review of x-rays shows he had a displaced left intertrochanteric fracture.  IMPRESSION:  Left hip intertrochanteric fracture with comorbid gastrointestinal bleed; with history of atrial fibrillation, on Pradaxa; history of renal insufficiency; diabetes; hypertension; hypothyroidism.  PLAN:  At this time after reviewing the x-rays and reviewing the patient's medical history, Dr. Theda Sers current treatment recommendation is to allow him to stabilize and improve after having the endoscopy today; having Medicine, Cardiology and GI tune Nicolas Snyder  up for planned probable Friday, October 07, 2010, closed reduction and internal fixation of his left hip intertrochanteric fracture with an IM nail and screw fixation.  The patient will be made n.p.o. on the midnight of the October 06, 2010 for the planned surgery on Friday.  Surgeon will be Dr. Paralee Cancel.  We will repeat labs on the morning of the October 07, 2010.  We will continue with Buck's traction.  This plan was reviewed with the patient and he is in agreement with the plan.  The patient had no other questions.     Evert Kohl, P.A.   ______________________________ Cynda Familia, M.D.    RWK/MEDQ  D:  10/05/2010  T:  10/06/2010  Job:  [id number]  cc:    Cynda Familia, M.D. Fax: [phone]  Electronically Signed by Lorretta Harp P.A. on 10/12/2010 07:45:04 AM Electronically Signed by Sydnee Cabal M.D. on 11/03/2010 09:26:59 AM

## 2010-11-06 NOTE — Consult Note (Signed)
NAME:  AUSITN, MANY NO.:  [id number]  MEDICAL RECORD NO.:  [id number]           PATIENT TYPE:  E  LOCATION:  WLED                         FACILITY:  [facility]  PHYSICIAN:  Johnney Ou, MD DATE OF BIRTH:  October 25, 1920  DATE OF CONSULTATION:  10/04/2010 DATE OF DISCHARGE:                                CONSULTATION   REASON FOR CONSULTATION:  Atrial fibrillation.  PRIMARY CARE PHYSICIAN:  Haywood Pao, M.D.  PRIMARY CARDIOLOGIST:  Thayer Headings, M.D.  CHIEF COMPLAINT:  Passed out, left hip pain.  HISTORY OF PRESENT ILLNESS:  This is an 77 year old white male with a history of atrial fibrillation, hypertension, diabetes, who presents here after passing out at home.  He states that he fell while he was preparing dinner for his wife.  Woke up on the ground and is unable to recollect the surrounding events and denies any prodrome other than feeling poorly and not eating during the day.  He states that over the past week he has had a lack of energy and has noticed a significant step- down in his overall health and energy level.  He states he did not have appetite and only had one Ensure during the day.  He only additionally describes throwing up that Ensure on the ambulance ride to the emergency room.  He otherwise denies chest pain, palpitations, increased lower extremity edema, dyspnea on exertion, paroxysmal nocturnal dyspnea, or orthopnea.  PAST MEDICAL HISTORY: 1. Atrial fibrillation, currently on Pradaxa. 2. Hypertension. 3. Hypothyroidism. 4. Diabetes type 2. 5. Chronic kidney disease, creatinine baseline is approximately 1.9.  ALLERGIES:  PENICILLIN.  MEDICATIONS ON ADMISSION: 1. Pradaxa 150 twice daily. 2. Glipizide 10 mg daily. 3. Synthroid 75 mcg daily. 4. Diltiazem 240 mg daily. 5. Losartan hydrochlorothiazide 100/25 mg daily. 6. Pravachol 10 mg daily. 7. Lisinopril 40 mg daily. 8. Tricor 48 mg daily. 9. Flomax 0.4 mg  daily. 10.Aspirin 81 mg daily. 11.Multivitamin.  SOCIAL HISTORY:  He lives in [place] with his wife who has dementia. He is retired from [employer].  He quit smoking in 1961.  FAMILY HISTORY:  Negative for premature coronary artery disease.  REVIEW OF SYSTEMS:  All 14 systems were reviewed and were negative except as mentioned in detail in HPI.  PHYSICAL EXAMINATION:  VITAL SIGNS:  Blood pressure is 136/78, respiratory rate 16, pulse 87, satting 96% on room air. GENERAL:  He is elderly-appearing male in mild acute distress. HEENT:  Moist mucous membranes.  He has an ecchymosis on his right scalp. NECK:  No jugular venous distention.  No thyromegaly. CARDIOVASCULAR:  Irregularly irregular.  No murmurs, rubs, or gallops. LUNGS:  Clear to auscultation bilaterally. ABDOMEN:  Nontender, nondistended.  Positive bowel sounds.  No masses. EXTREMITIES:  He has a left lower extremity external rotation with tenderness to palpation on the left hip with no lower extremity pitting edema. NEUROLOGIC:  Alert and oriented x3.  Cranial nerves II-XII grossly intact.  No focal neurologic deficit. SKIN:  Warm, dry, and intact with scattered ecchymoses. PSYCH:  Mood and affect are appropriate.  RADIOLOGY:  Chest x-ray showed  chronic interstitial changes and hip x- ray showed left intertrochanteric femur fracture.  EKG showed atrial fibrillation with a rate of 87 beats per minute with nonspecific ST changes and low voltage.  LABORATORY REVIEW:  White count 16.9, hematocrit 30.9.  Potassium is 3.4, creatinine is 2.44.  INR is 2.5, BUN is 70.  ASSESSMENT AND PLAN:  This is an 77 year old status post fall complicated by hip fracture. 1. Atrial fibrillation.  He is currently rate-controlled.  Continue     his calcium channel blocker.  I also suspect rapid ventricular     response contributing to his syncope and I would recommend that he     have a telemetry bed postop.  He is given fresh frozen  plasma for     his elevated INR and suspected gastrointestinal bleed and will hold     chronic anticoagulation until his gastrointestinal bleed is     resolved, though I do not know the extent of this. 2. Syncope.  This seems to be due to dehydration as evidenced by his     acute renal failure.  He will need a volume expansion and further     monitoring and will consider an echocardiogram while he is here.     However, he does not appear to be in left ventricular failure and     marginally perfused due to hypovolemia. 3. Acute renal failure, is likely prerenal due to his decreased p.o.     intake over the past few weeks.  We will discontinue his afterload     reducing agents and follow closely with volume replacement. 4. Hypertension, is currently holding medications. We will monitor his     blood pressure carefully. 5. Hyperlipidemia.  He is on statin therapy.  He is otherwise cleared for surgery and will follow during his hospital stay.     Johnney Ou, MD     BHH/MEDQ  D:  10/04/2010  T:  10/04/2010  Job:  [id number]  Electronically Signed by Matthew Saras MD on 11/06/2010 06:53:04 PM

## 2010-11-08 ENCOUNTER — Encounter (INDEPENDENT_AMBULATORY_CARE_PROVIDER_SITE_OTHER): Payer: Medicare Other | Admitting: Vascular Surgery

## 2010-11-08 DIAGNOSIS — I70219 Atherosclerosis of native arteries of extremities with intermittent claudication, unspecified extremity: Secondary | ICD-10-CM

## 2010-11-09 NOTE — Consult Note (Signed)
NEW PATIENT CONSULTATION  Nicolas Snyder, Nicolas Snyder DOB:  [birth year]                                       11/08/2010 [id number]  Patient presents today for evaluation of lower extremity flow.  He is a very pleasant 77 year old gentleman recently admitted with a hip fracture on the left and then a convalescence stay at [facility].  He apparently had severe anemia resulting in syncope and a fall resulting in a left hip fracture.  He developed a superficial ulceration over his left foot and is seen today for further evaluation of this.  He did undergo noninvasive vascular laboratory studies from [facility].  This showed triphasic waveform to the level of the popliteal and  monophasic at the tibial level.  He has normal ankle-arm indices bilaterally.  He had a venous study showing no evidence of DVT bilaterally.  PAST MEDICAL HISTORY:  Significant for diabetes, hypertension, elevated cholesterol.  He has a history of kidney stones, cataracts, and had a recent pinning of his broken hip.  SOCIAL HISTORY:  He is married with 1 child.  He is retired.  He quit smoking in 1962.  Does not drink alcohol.  FAMILY HISTORY:  Positive for stroke at age 39 and he died from this.  REVIEW OF SYSTEMS:  Weight loss, loss of appetite.  He weighs 165 pounds.  He is 5 feet 10 inches tall. CARDIAC:  Positive for chest tightness, pressure, and atrial fibrillation. GI:  Positive for a GI bleed. NEUROLOGIC:  Dizziness. PSYCHIATRIC:  __________. Review of systems otherwise negative.  PHYSICAL EXAMINATION:  A well-developed, thin white male appearing stated age in no acute stress.  Blood pressure 164/84, pulse 119, respirations 18.  HEENT:  Normal.  His radial, femoral, and posterior tibial pulses are 2+ bilaterally.  He does have an ulceration over his left medial ankle.  He had an Haematologist placed over this.  This is quite superficial and  appears to be healing with a very nice granulating base.  I discussed the significance of all of this, including his vascular laboratory study with patient.  I feel that he has plenty of flow.  I think that it is insignificant regarding his anterior tibial occlusive disease with normal flow into the posterior tibial arteries.  He was reassured with this discussion and will see [place] again on an as- needed basis.  He is instructed to continue the use of Unna boot until he has healing of his ulcer.    Rosetta Posner, M.D. Electronically Signed  TFE/MEDQ  D:  11/08/2010  T:  11/09/2010  Job:  [id number]  cc:   Annie Main A. Forde Dandy, M.D.

## 2010-11-22 NOTE — Discharge Summary (Signed)
NAME:  AARIT, DEPNER NO.:  [id number]   MEDICAL RECORD NO.:  [id number]          PATIENT TYPE:  INP   LOCATION:  1409                         FACILITY:  [facility]   PHYSICIAN:  Haywood Pao, M.D.DATE OF BIRTH:  1921/01/30   DATE OF ADMISSION:  09/27/2007  DATE OF DISCHARGE:  09/30/2007                               DISCHARGE SUMMARY   FINAL DIAGNOSES:  1. Partial small-bowel obstruction, resolved with conservative      management.  2. Hypertension.  3. Hyperlipidemia.  4. Diabetes mellitus type 2.  5. Hypothyroidism.   MEDICATIONS ON DISCHARGE:  1. Diltiazem 240 mg extended release once daily.  2. Synthroid 75 mcg once daily.  3. Glucotrol 5 mg once daily.  4. Lisinopril 40 mg once daily, to be resumed March 25 a.m.  5. Hyzaar 100/25 mg once daily to be resumed March 25 a.m.  6. Aspirin 81 mg once daily.  7. Pravachol 10 mg once daily.  8. Tricor 145 mg once daily to be resumed March 25 a.m.   SUMMARY OF LABORATORIES:  BUN and creatinine 20 and 1.8, respectively  with glucose 155, normal white count 8.4, hemoglobin 12.2, hematocrit  35.7, platelets 209.  Initial creatinine was 2.2.  LFTs, otherwise,  normal.   RADIOLOGY:  The patient had CAT scan and x-ray day #1 and day #2 of his  visit which showed evidence of partial small-bowel obstruction, history  of prior removal of kidney stones with nephrotomy.   DISCHARGE PHYSICAL EXAMINATION:  VITAL SIGNS:  blood pressure 130/60,  heart rate 66, respirations 16, temperature 97.9, sating 92% on 1 liter.  GENERAL:  No acute distress.  HEENT: Normocephalic, atraumatic.  PERRLA.  EOMI.  ENT is within normal  limits.  NECK:  Supple.  No lymphadenopathy, JVD or bruit.  HEART:  Regular rate and rhythm.  No murmur, rub or gallop are  appreciated.  LUNGS: Clear to auscultation bilaterally.  ABDOMEN: Soft, nontender, normoactive bowel sounds.  No  hepatosplenomegaly.  EXTREMITIES:  No clubbing, cyanosis or  edema.   HOSPITAL COURSE:  Mr. Hellmich was admitted through the emergency room after  having lower mid abdominal pain, nausea and vomiting.  Did not have  bowel movement in approximately 2 days.  He sought medical attention at  the emergency room on September 27, 2007, and was diagnosed with partial  small-bowel obstruction following x-ray and CAT scan.  We were asked to  admit the patient.  Consultation with surgery was undertaken with Dr.  Rise Patience, an NG tube was placed, and the patient was treated with  conservative measures.  We were concerned about the possibility of  adhesions given prior intra-abdominal surgery.  The patient had  considerable decompression with about 700 mL of brown copious fluid  removed upon suction.  On day #2, the NG tube was discontinued and on  day #3, the patient was restarted on clear liquid diet which she  tolerated well.  He had produced several bowel movements on day #3 as  well.  On day #4, the patient was advanced to a carb modified diet  and  did reasonably well as he was clinically not showing signs of any  obstruction with both bowel movements and gas passing from below, and no  further nausea or vomiting.  The patient was discharged home in good  care.  The patient is able to take care of himself.  He was discharged  home.  He will be followed in my office in one week, and the patient has  been given instructions to call for an appointment.  He is to return to  the emergency room he has any worsening of pain or to call the physician  on-call with our group if he has any questions.  The patient was  recommended to take a regular bowel regimen if he has not had bowel  movements within 2 days including Colace and MiraLax.   CONDITION ON DISCHARGE:  Stable      Haywood Pao, M.D.  Electronically Signed     RWT/MEDQ  D:  09/30/2007  T:  10/01/2007  Job:  [id number]

## 2010-11-22 NOTE — H&P (Signed)
NAME:  [name], [name] NO.:  [id number]   MEDICAL RECORD NO.:  [id number]          PATIENT TYPE:  INP   LOCATION:  44                         FACILITY:  [facility]   PHYSICIAN:  Orson Ape. Weatherly, M.D.DATE OF BIRTH:  12-23-1920   DATE OF ADMISSION:  09/27/2007  DATE OF DISCHARGE:  09/30/2007                              HISTORY & PHYSICAL   CHIEF COMPLAINT:  Nausea and vomiting.   HISTORY:  Nicolas Snyder is an 76 year old male who was admitted through  the ER by Dr. Brigitte Pulse.  He has known type 2 diabetes and has had previous  open kidney stones removed in 08/2003.  He presented to the emergency  room with approximately 8-12 hour history of lower abdominal pain and  cramping.  It had increased and then vomited 2-3 times in the emergency  room.  While in the emergency room, I was asked to see the patient.  He  stated that the cramping and pain occurred the preceding day.  It  radiates down to the groin.  On the x-rays obtained in the ER, it showed  a partial small bowel obstruction.  A contrast CT was performed which  showed a markedly dilated stomach.  In the ER, I placed a nasogastric  tube and there was approximately 2 canisters of very bilious NG drainage  withdrawn.  The patient stated that he had had bowel movements  approximately 2 days earlier and has not had any fever.  I hoped that  the obstruction is secondary to adhesions.  He is not on high blood  pressure medication.  He is not on blood thinners.  I would repeat plain  abdominal film at noon.  If the obstruction does not improve after  conservative management of NG, IVs and etc., he made need an exploratory  laparotomy.  His white count is 14,500, his BUN is 30.   I have talked with the patient's family members about the plan and have  ordered the follow up plain abdominal x-rays.  Will follow along with  you and thank you for asking me to see this patient with a small bowel  obstruction presumably  secondary to adhesions.           ______________________________  Orson Ape. Rise Patience, M.D.     WJW/MEDQ  D:  11/13/2007  T:  11/13/2007  Job:  [id number]

## 2010-11-22 NOTE — H&P (Signed)
NAME:  Nicolas Snyder, Nicolas Snyder NO.:  [id number]   MEDICAL RECORD NO.:  [id number]          PATIENT TYPE:  INP   LOCATION:  1409                         FACILITY:  [facility]   PHYSICIAN:  Janalyn Rouse, M.D.  DATE OF BIRTH:  January 04, 1921   DATE OF ADMISSION:  09/27/2007  DATE OF DISCHARGE:                              HISTORY & PHYSICAL   CHIEF COMPLAINT:  Abdominal pain.   HISTORY OF PRESENT ILLNESS:  Mr. Nicolas Snyder is an 77 year old white male with  a history of type 2 diabetes, hypertension, and history of  nephrolithiasis status post open removal (March 2005) who presented to  the emergency department last evening with about 8-12 hours of lower  abdominal pain.  He reports that he was in his usual state of health  yesterday prior to lunch.  He ate lunch with some neighbors and  subsequently developed aching, crampy lower abdominal pain which  progressively worsened.  He came to the emergency department last  evening around 8 p.m. where a CT was performed revealing findings  consistent with partial small-bowel obstruction.  This was an oral  contrasted CT but no IV contrast was administered.  He subsequently  vomited three times in the emergency department and an NG tube was  subsequently placed with large amounts of brown drainage.  He reports  that his last bowel movement was 2 days ago and was normal.  He has not  had any flatulence.  Denies fevers, chills or sweats.  He does not have  a history of small-bowel obstruction.  His pain was improved with NG  suction and Dilaudid in the emergency department and he is currently  resting comfortably.   REVIEW OF SYSTEMS:  All systems are reviewed with the patient and are  negative except in the HPI.   PAST MEDICAL HISTORY:  1. Type 2 diabetes, controlled per the patient on oral therapy.  2. Hypertension.  3. Hyperlipidemia.  4. Hypothyroidism.  5. History of nephrolithiasis status post surgical removal (March      2005).  6.  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   CURRENT MEDICATIONS:  1. Diltiazem 240 mg daily.  2. Synthroid 75 mcg daily.  3. Glucotrol 5 mg daily.  4. Lisinopril 40 mg daily.  5. TriCor 145 mg daily.  6. Hyzaar 100/25 mg daily.  7. Aspirin 81 mg daily.  8. Pravachol 10 mg daily.   ALLERGIES:  PENICILLIN.   SOCIAL HISTORY:  He is married with one son and no grandchildren.  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  He quit  smoking in 1961.  No recent alcohol use.   FAMILY HISTORY:  Father died of a stroke at 17.  Mother had pancreatic  cancer and died at 59.   PHYSICAL EXAMINATION:  Temperature initially 99.0, currently 97.1.  Heart rate 65, up to 97.  Blood pressure initially 170/73 and now  145/91.  Respirations 16, oxygen saturation 97% on 2 L.  GENERAL:  He is pleasant gentleman who looks much younger than  his  stated age.  He is in no acute distress.  HEENT:  He has a left ptosis and his left pupil is minimally reactive to  light.  Oropharynx is dry.  NECK:  Supple without lymphadenopathy or JVD.  HEART:  Regular rate and rhythm with frequent ectopy.  LUNGS:  Clear to auscultation bilaterally.  ABDOMEN:  Soft, nondistended, with no bowel sounds.  He is nontender  with no rebound or guarding.  EXTREMITIES:  No clubbing, cyanosis or edema.  SKIN:  He has a midline incision scar from his lower umbilicus to the  pubis.   LABORATORIES:  CBC shows a white count of 14.5, hemoglobin 16.2,  platelets 291.  BMET significant for sodium 133, potassium 3.0, chloride  89, bicarb 29, BUN 30, creatinine 2.2, glucose 217.  Total bilirubin  1.4.  Other liver function tests are normal.  INR 0.9.  Urinalysis 36  white blood cells.   STUDIES:  1. CT of the abdomen and pelvis without contrast shows probable      partial small-bowel obstruction with a transition to a nondilated      distal small-bowel in the right lower quadrant.  Stable  small      pancreatic head cyst.  Bilateral renal cysts.  Enlarged prostate.  2. Chest x-ray:  Shows bibasilar atelectasis.   ASSESSMENT/PLAN:  1. Small-bowel obstruction:  I suspect that this is a complete small-      bowel obstruction given the copious amounts of NG suction that have      resulted from NG placement.  This is likely due to adhesions from      his prior open nephrolithiasis surgery as he does not appear toxic      with no signs of diverticulitis or suggestion of abscess on exam or      CT.  Will admit for bowel rest, NG suction, IV fluid hydration, and      antiemetics.  Will consult general surgery with plans to repeat an      acute abdominal series tomorrow and consideration of surgical      management if it is unimproved in the next 24-48 hours despite      conservative measures.  Despite his advanced age and medical      comorbidities, I do think that his surgical risk is acceptable if      deemed necessary by surgery.  2. Acute renal failure:  Will monitor with hydration, likely due to      prerenal azotemia.  3. Hypokalemia:  Will replete with IV potassium runs x4 and add      potassium to his IV fluids.  Will recheck his potassium later this      afternoon and replete as needed.  4. Type 2 diabetes:  Will hold his oral agents and cover with NovoLog      sliding scale insulin q.4h.  5. Hypertension:  Will hold his oral antihypertensives and give      Lopressor as needed.  Will obtain an EKG due to his ectopy.  Will      place on a telemetry bed.  Will change to scheduled Lopressor or      diltiazem drip if his blood pressure or heart rate remain elevated.  6. DVT prophylaxis:  Lovenox unless surgery feels that this needs to      be held.  7. Disposition:  Anticipate that he will be able to return home after      this acute hospitalization.  Janalyn Rouse, M.D.  Electronically Signed     WS/MEDQ  D:  09/28/2007  T:  09/28/2007  Job:  [id number]

## 2010-11-22 NOTE — Consult Note (Signed)
NAME:  ADRAN, EKLUND NO.:  [id number]   MEDICAL RECORD NO.:  [id number]          PATIENT TYPE:  INP   LOCATION:  39                         FACILITY:  [facility]   PHYSICIAN:  Orson Ape. Weatherly, M.D.DATE OF BIRTH:  Jul 15, 1920   DATE OF CONSULTATION:  09/28/2007  DATE OF DISCHARGE:                                 CONSULTATION   CHIEF COMPLAINT:  __________ .   HISTORY:  Rachelle Franks __________  year or two ago and __________ .  About 4-5 years ago, he __________  a right kidney stone, and he has not  had __________  abdominal pain until yesterday after eating, he started  having a cramping pain along the abdominal __________ .  He __________  radiating down towards his private area.  He came to the emergency room  and was seen by the ER physician __________  partial small bowel  obstruction findings.  A contrasted CT was done which __________ .  He  had a bowel movement approximately two days earlier.  He denies any  __________           ______________________________  Orson Ape. Rise Patience, M.D.     WJW/MEDQ  D:  09/28/2007  T:  09/28/2007  Job:  [id number]

## 2010-12-16 ENCOUNTER — Encounter: Payer: Self-pay | Admitting: Cardiovascular Disease

## 2011-04-03 LAB — URINALYSIS, ROUTINE W REFLEX MICROSCOPIC
Bilirubin Urine: NEGATIVE
Glucose, UA: 100 — AB
Hgb urine dipstick: NEGATIVE
Nitrite: NEGATIVE
Protein, ur: 30 — AB
Specific Gravity, Urine: 1.02
Urobilinogen, UA: 0.2
pH: 8

## 2011-04-03 LAB — CBC
HCT: 35.7 — ABNORMAL LOW
HCT: 43.2
HCT: 48.4
Hemoglobin: 12.2 — ABNORMAL LOW
Hemoglobin: 16.2
MCHC: 33.4
MCHC: 33.5
MCHC: 34.1
MCV: 88
MCV: 88.2
MCV: 89.5
Platelets: 231
Platelets: 291
RBC: 3.99 — ABNORMAL LOW
RBC: 4.83
RBC: 5.49
RDW: 12.8
RDW: 12.9
WBC: 14.5 — ABNORMAL HIGH
WBC: 14.6 — ABNORMAL HIGH
WBC: 8.4

## 2011-04-03 LAB — BASIC METABOLIC PANEL
BUN: 26 — ABNORMAL HIGH
CO2: 28
CO2: 32
CO2: 34 — ABNORMAL HIGH
Calcium: 8.3 — ABNORMAL LOW
Chloride: 100
Chloride: 105
Creatinine, Ser: 1.88 — ABNORMAL HIGH
Creatinine, Ser: 2.13 — ABNORMAL HIGH
GFR calc Af Amer: 38 — ABNORMAL LOW
GFR calc Af Amer: 42 — ABNORMAL LOW
GFR calc non Af Amer: 34 — ABNORMAL LOW
Glucose, Bld: 144 — ABNORMAL HIGH
Potassium: 3.5

## 2011-04-03 LAB — DIFFERENTIAL
Basophils Absolute: 0
Basophils Relative: 0
Eosinophils Absolute: 0
Eosinophils Relative: 0
Lymphocytes Relative: 5 — ABNORMAL LOW
Lymphs Abs: 0.7
Monocytes Absolute: 0.4
Monocytes Relative: 3
Neutro Abs: 13.4 — ABNORMAL HIGH
Neutrophils Relative %: 92 — ABNORMAL HIGH

## 2011-04-03 LAB — APTT: aPTT: 28

## 2011-04-03 LAB — COMPREHENSIVE METABOLIC PANEL
AST: 23
Albumin: 4.1
Alkaline Phosphatase: 31 — ABNORMAL LOW
BUN: 30 — ABNORMAL HIGH
Chloride: 89 — ABNORMAL LOW
GFR calc Af Amer: 34 — ABNORMAL LOW
Potassium: 3 — ABNORMAL LOW
Sodium: 133 — ABNORMAL LOW
Total Bilirubin: 1.4 — ABNORMAL HIGH
Total Protein: 7.5

## 2011-04-03 LAB — COMPREHENSIVE METABOLIC PANEL WITH GFR
ALT: 17
CO2: 29
Calcium: 9.2
Creatinine, Ser: 2.23 — ABNORMAL HIGH
GFR calc non Af Amer: 28 — ABNORMAL LOW
Glucose, Bld: 217 — ABNORMAL HIGH

## 2011-04-03 LAB — PROTIME-INR
INR: 0.9
Prothrombin Time: 12.7

## 2011-04-03 LAB — URINE MICROSCOPIC-ADD ON

## 2011-07-14 DIAGNOSIS — R82998 Other abnormal findings in urine: Secondary | ICD-10-CM | POA: Diagnosis not present

## 2011-07-14 DIAGNOSIS — I10 Essential (primary) hypertension: Secondary | ICD-10-CM | POA: Diagnosis not present

## 2011-07-14 DIAGNOSIS — E039 Hypothyroidism, unspecified: Secondary | ICD-10-CM | POA: Diagnosis not present

## 2011-07-14 DIAGNOSIS — M810 Age-related osteoporosis without current pathological fracture: Secondary | ICD-10-CM | POA: Diagnosis not present

## 2011-07-14 DIAGNOSIS — Z125 Encounter for screening for malignant neoplasm of prostate: Secondary | ICD-10-CM | POA: Diagnosis not present

## 2011-07-14 DIAGNOSIS — E785 Hyperlipidemia, unspecified: Secondary | ICD-10-CM | POA: Diagnosis not present

## 2011-07-20 DIAGNOSIS — Z1212 Encounter for screening for malignant neoplasm of rectum: Secondary | ICD-10-CM | POA: Diagnosis not present

## 2011-07-21 DIAGNOSIS — Z0000 Encounter for general adult medical examination without abnormal findings: Secondary | ICD-10-CM | POA: Diagnosis not present

## 2011-07-21 DIAGNOSIS — I10 Essential (primary) hypertension: Secondary | ICD-10-CM | POA: Diagnosis not present

## 2011-07-21 DIAGNOSIS — Z125 Encounter for screening for malignant neoplasm of prostate: Secondary | ICD-10-CM | POA: Diagnosis not present

## 2011-07-21 DIAGNOSIS — N184 Chronic kidney disease, stage 4 (severe): Secondary | ICD-10-CM | POA: Diagnosis not present

## 2011-07-21 DIAGNOSIS — E1129 Type 2 diabetes mellitus with other diabetic kidney complication: Secondary | ICD-10-CM | POA: Diagnosis not present

## 2011-12-06 DIAGNOSIS — E1129 Type 2 diabetes mellitus with other diabetic kidney complication: Secondary | ICD-10-CM | POA: Diagnosis not present

## 2011-12-06 DIAGNOSIS — L259 Unspecified contact dermatitis, unspecified cause: Secondary | ICD-10-CM | POA: Diagnosis not present

## 2011-12-06 DIAGNOSIS — I10 Essential (primary) hypertension: Secondary | ICD-10-CM | POA: Diagnosis not present

## 2011-12-06 DIAGNOSIS — E039 Hypothyroidism, unspecified: Secondary | ICD-10-CM | POA: Diagnosis not present

## 2012-01-25 DIAGNOSIS — B359 Dermatophytosis, unspecified: Secondary | ICD-10-CM | POA: Diagnosis not present

## 2012-01-25 DIAGNOSIS — L259 Unspecified contact dermatitis, unspecified cause: Secondary | ICD-10-CM | POA: Diagnosis not present

## 2012-01-25 DIAGNOSIS — L299 Pruritus, unspecified: Secondary | ICD-10-CM | POA: Diagnosis not present

## 2012-02-02 DIAGNOSIS — Z961 Presence of intraocular lens: Secondary | ICD-10-CM | POA: Diagnosis not present

## 2012-02-02 DIAGNOSIS — H1820 Unspecified corneal edema: Secondary | ICD-10-CM | POA: Diagnosis not present

## 2012-02-02 DIAGNOSIS — H3530 Unspecified macular degeneration: Secondary | ICD-10-CM | POA: Diagnosis not present

## 2012-02-28 DIAGNOSIS — L299 Pruritus, unspecified: Secondary | ICD-10-CM | POA: Diagnosis not present

## 2012-02-28 DIAGNOSIS — C4330 Malignant melanoma of unspecified part of face: Secondary | ICD-10-CM | POA: Diagnosis not present

## 2012-02-28 DIAGNOSIS — L259 Unspecified contact dermatitis, unspecified cause: Secondary | ICD-10-CM | POA: Diagnosis not present

## 2012-03-12 DIAGNOSIS — C4330 Malignant melanoma of unspecified part of face: Secondary | ICD-10-CM | POA: Diagnosis not present

## 2012-03-12 DIAGNOSIS — L905 Scar conditions and fibrosis of skin: Secondary | ICD-10-CM | POA: Diagnosis not present

## 2012-03-13 DIAGNOSIS — C4330 Malignant melanoma of unspecified part of face: Secondary | ICD-10-CM | POA: Diagnosis not present

## 2012-03-21 DIAGNOSIS — Z23 Encounter for immunization: Secondary | ICD-10-CM | POA: Diagnosis not present

## 2012-03-21 DIAGNOSIS — E1129 Type 2 diabetes mellitus with other diabetic kidney complication: Secondary | ICD-10-CM | POA: Diagnosis not present

## 2012-03-21 DIAGNOSIS — E039 Hypothyroidism, unspecified: Secondary | ICD-10-CM | POA: Diagnosis not present

## 2012-03-21 DIAGNOSIS — R5381 Other malaise: Secondary | ICD-10-CM | POA: Diagnosis not present

## 2012-03-21 DIAGNOSIS — M810 Age-related osteoporosis without current pathological fracture: Secondary | ICD-10-CM | POA: Diagnosis not present

## 2012-03-21 DIAGNOSIS — Z79899 Other long term (current) drug therapy: Secondary | ICD-10-CM | POA: Diagnosis not present

## 2012-03-21 DIAGNOSIS — R5383 Other fatigue: Secondary | ICD-10-CM | POA: Diagnosis not present

## 2012-03-21 DIAGNOSIS — E785 Hyperlipidemia, unspecified: Secondary | ICD-10-CM | POA: Diagnosis not present

## 2012-03-21 DIAGNOSIS — I10 Essential (primary) hypertension: Secondary | ICD-10-CM | POA: Diagnosis not present

## 2012-04-24 IMAGING — CR DG CHEST 1V
1 series · 1 of 1 positions shown · non-contrast
Comparison: 09/28/2007.

CLINICAL DATA: Fall.  Pain.

CHEST - 1 VIEW

[t chest supine]
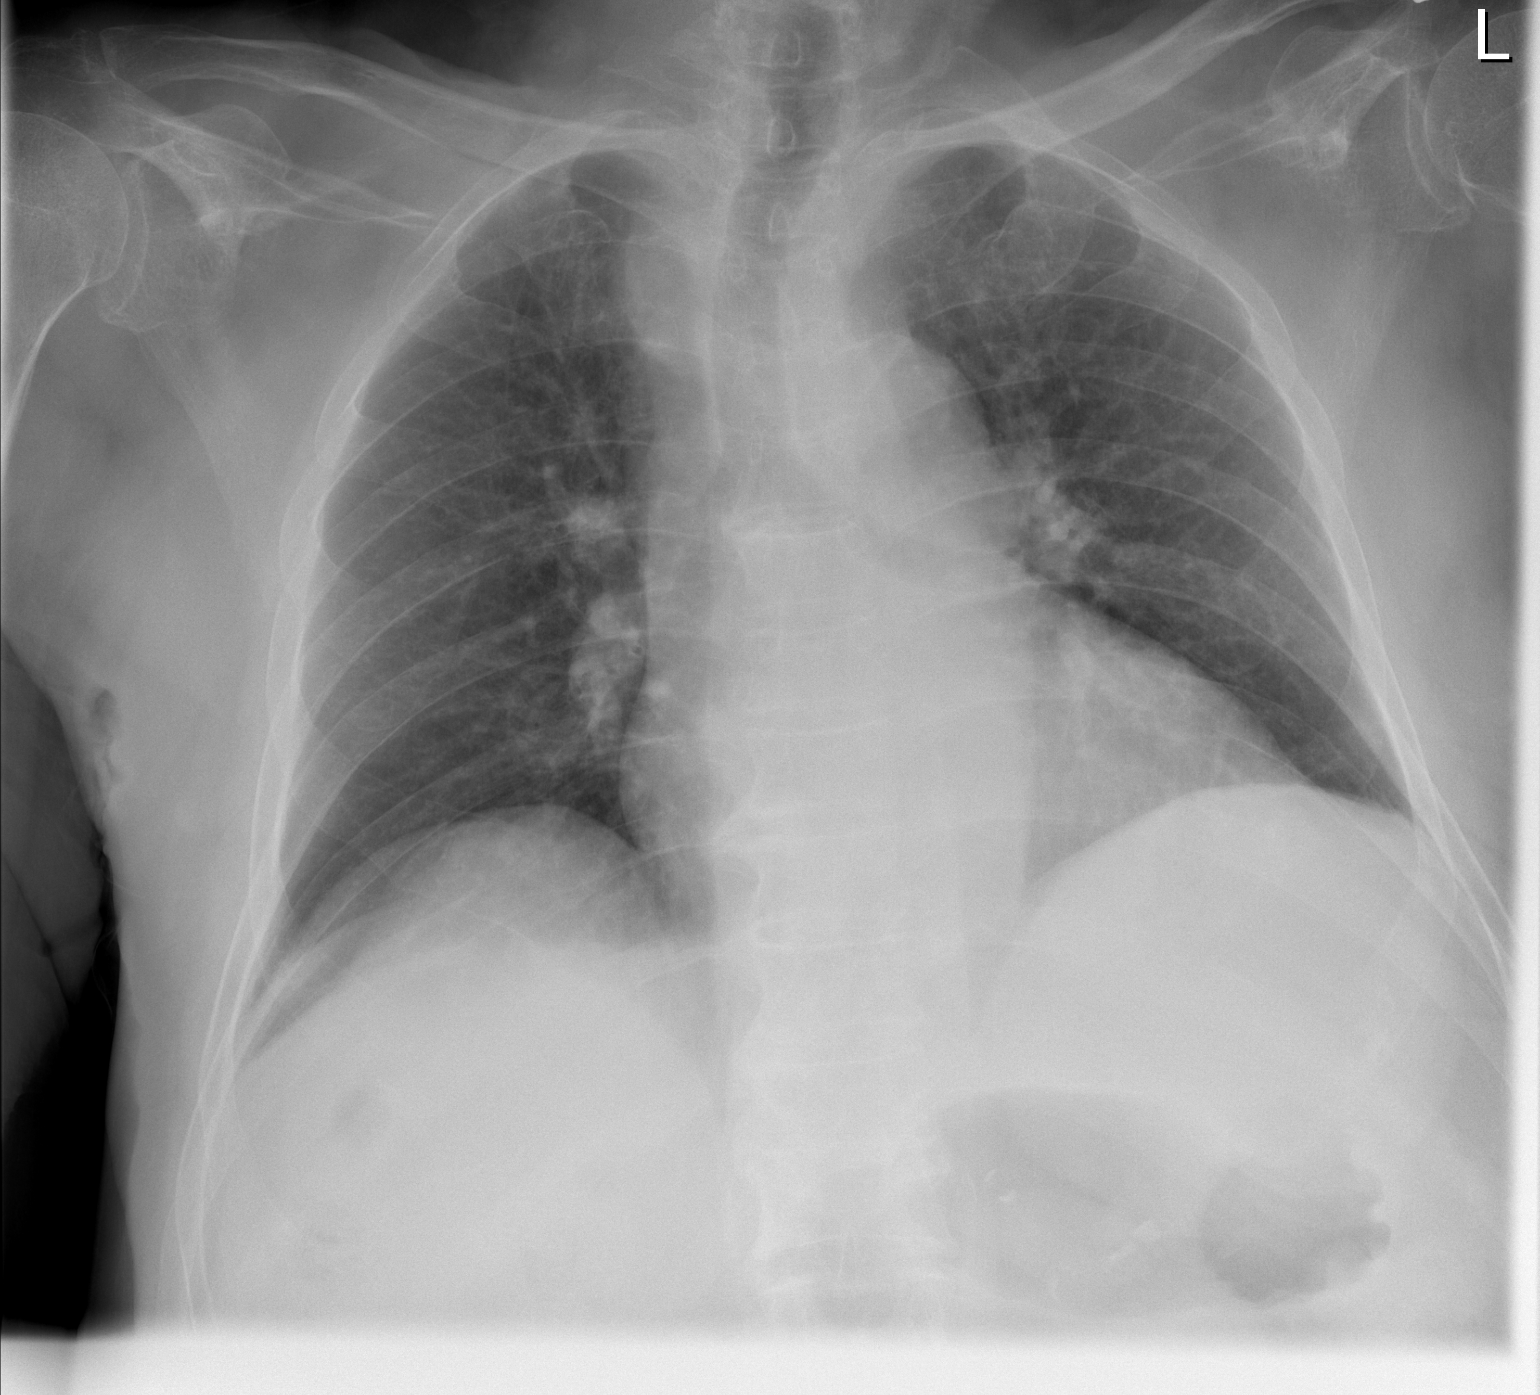

[1 of 1 positions shown; findings below may reference images not displayed]

FINDINGS: Lung volumes are low. The cardiopericardial silhouette is
enlarged. Interstitial markings are diffusely coarsened with
chronic features. No edema or focal pneumonia.  Bones are diffusely
demineralized.
IMPRESSION: Cardiomegaly with underlying chronic interstitial changes.

## 2012-04-25 IMAGING — CR DG ABD PORTABLE 1V
1 series · 2 of 2 positions shown · non-contrast
Comparison: Portable exam 1870 hours compared to 09/29/2007

CLINICAL DATA: GI bleed

ABDOMEN - 1 VIEW

[Series 1: ap (kub) · U · 2 of 2 slices shown]
[im 1/2]
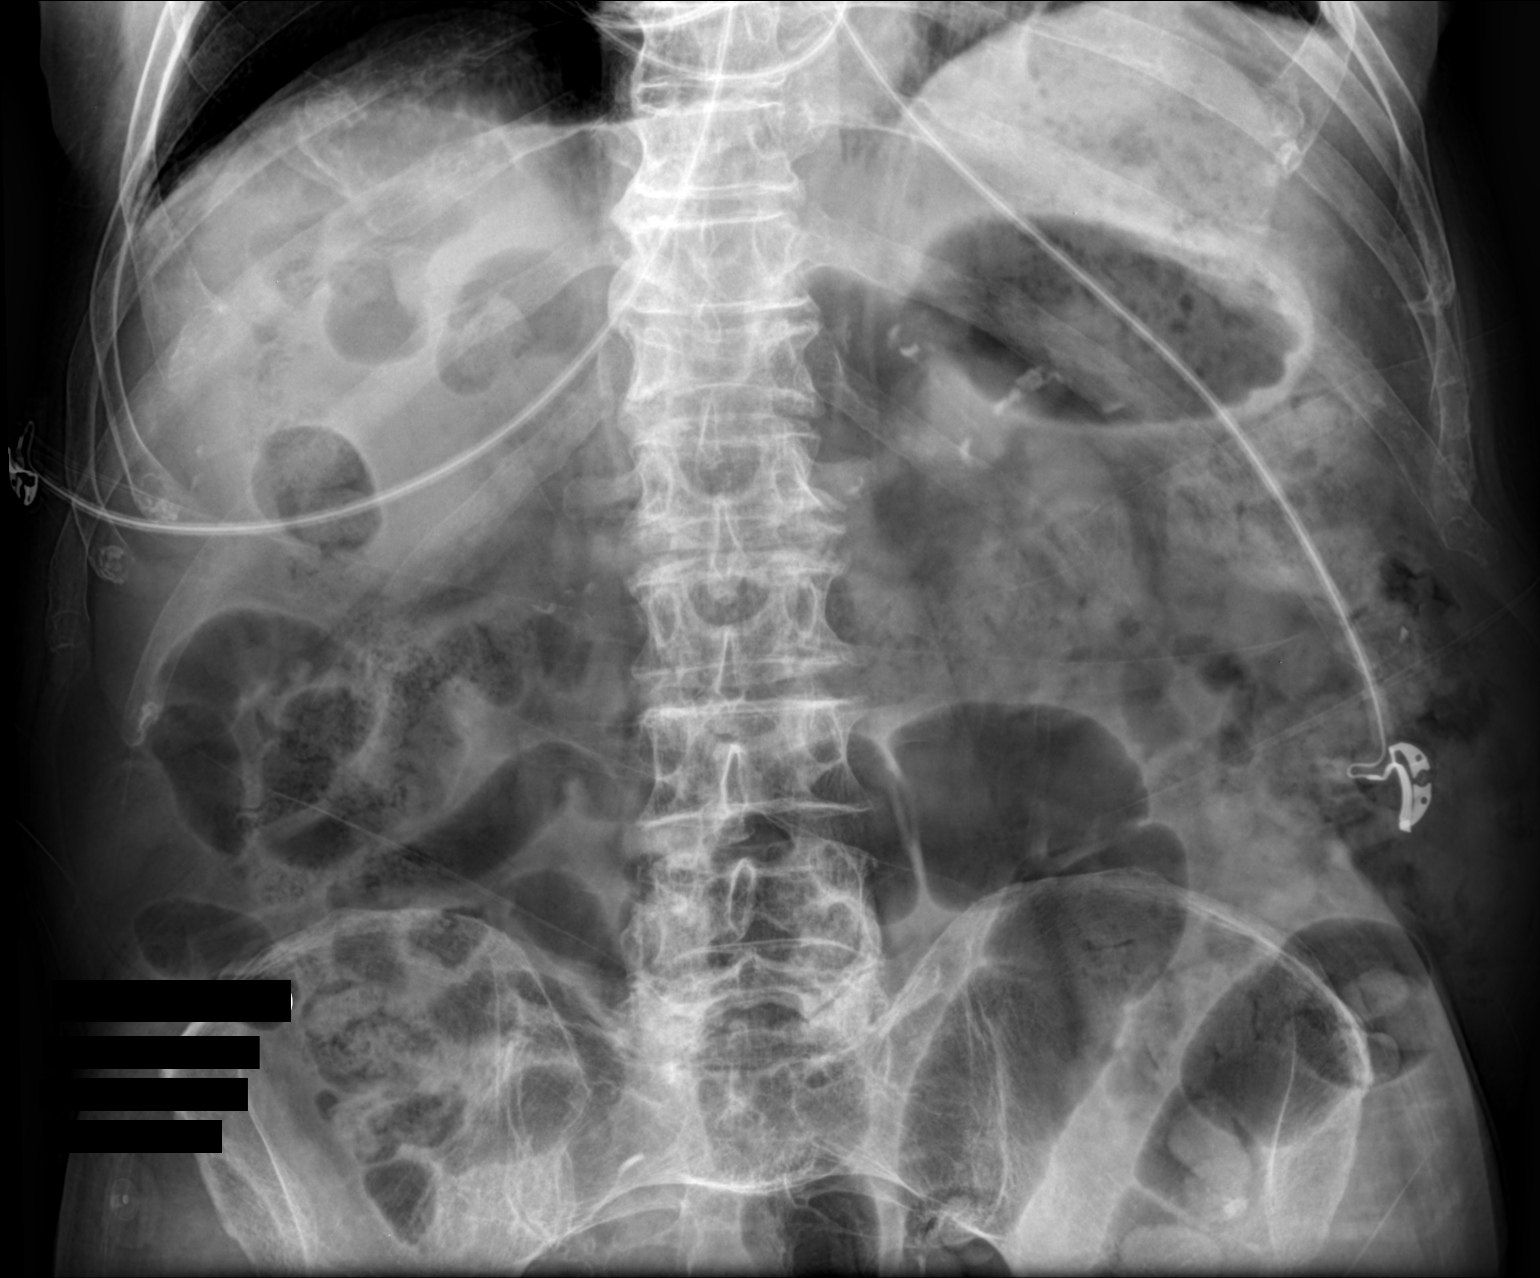
[im 2/2]
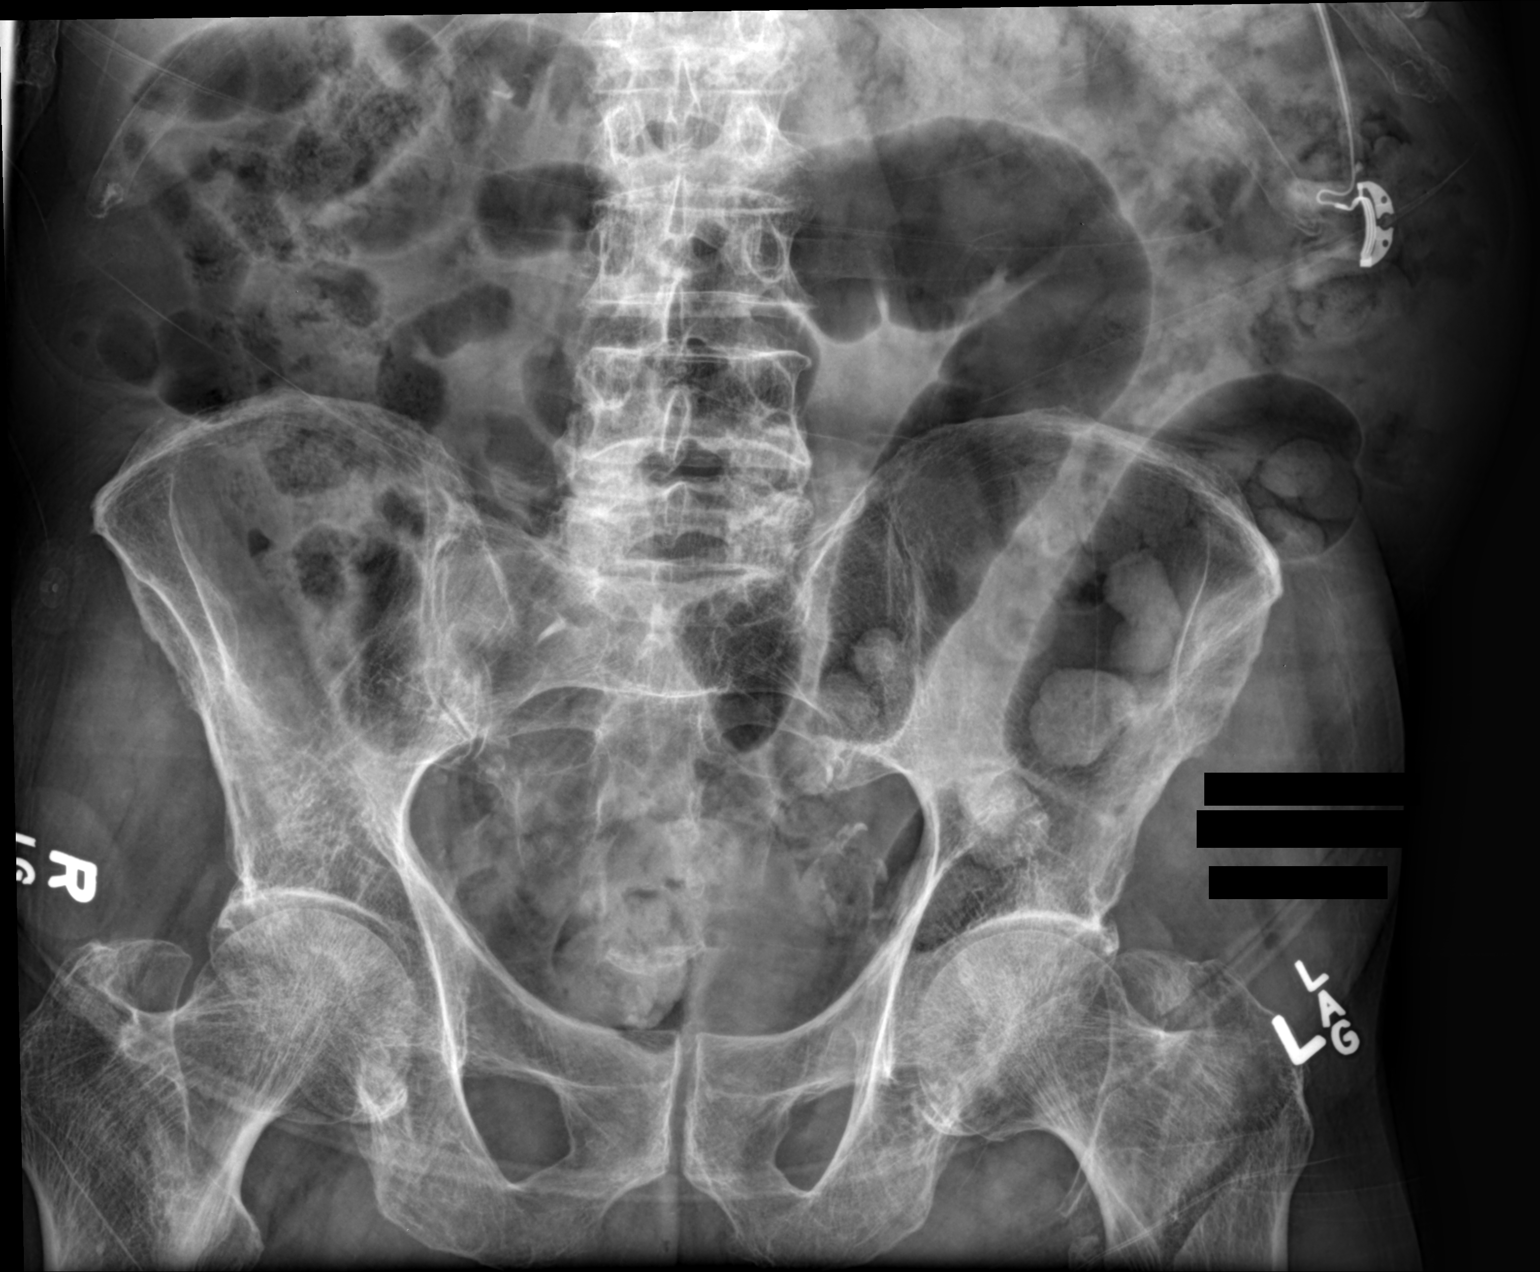

[2 of 2 positions shown; findings below may reference images not displayed]

FINDINGS: Nonobstructive bowel gas pattern.
Air filled loops of normal caliber small bowel.
Gas and stool in colon to rectum.
No bowel dilatation or bowel wall thickening.
Bones appear demineralized.
Compression deformity of T12 vertebral body.
Scattered end plate spur formation thoracolumbar spine.
IMPRESSION: Nonspecific bowel gas pattern.
T12 compression fracture, uncertain acuity.

## 2012-04-25 IMAGING — CR DG CHEST 1V PORT
1 series · 1 of 1 positions shown · non-contrast
Comparison: Portable exam 7604 hours compared to 10/04/2010

CLINICAL DATA: Syncope, shortness of breath

PORTABLE CHEST - 1 VIEW

[AP]
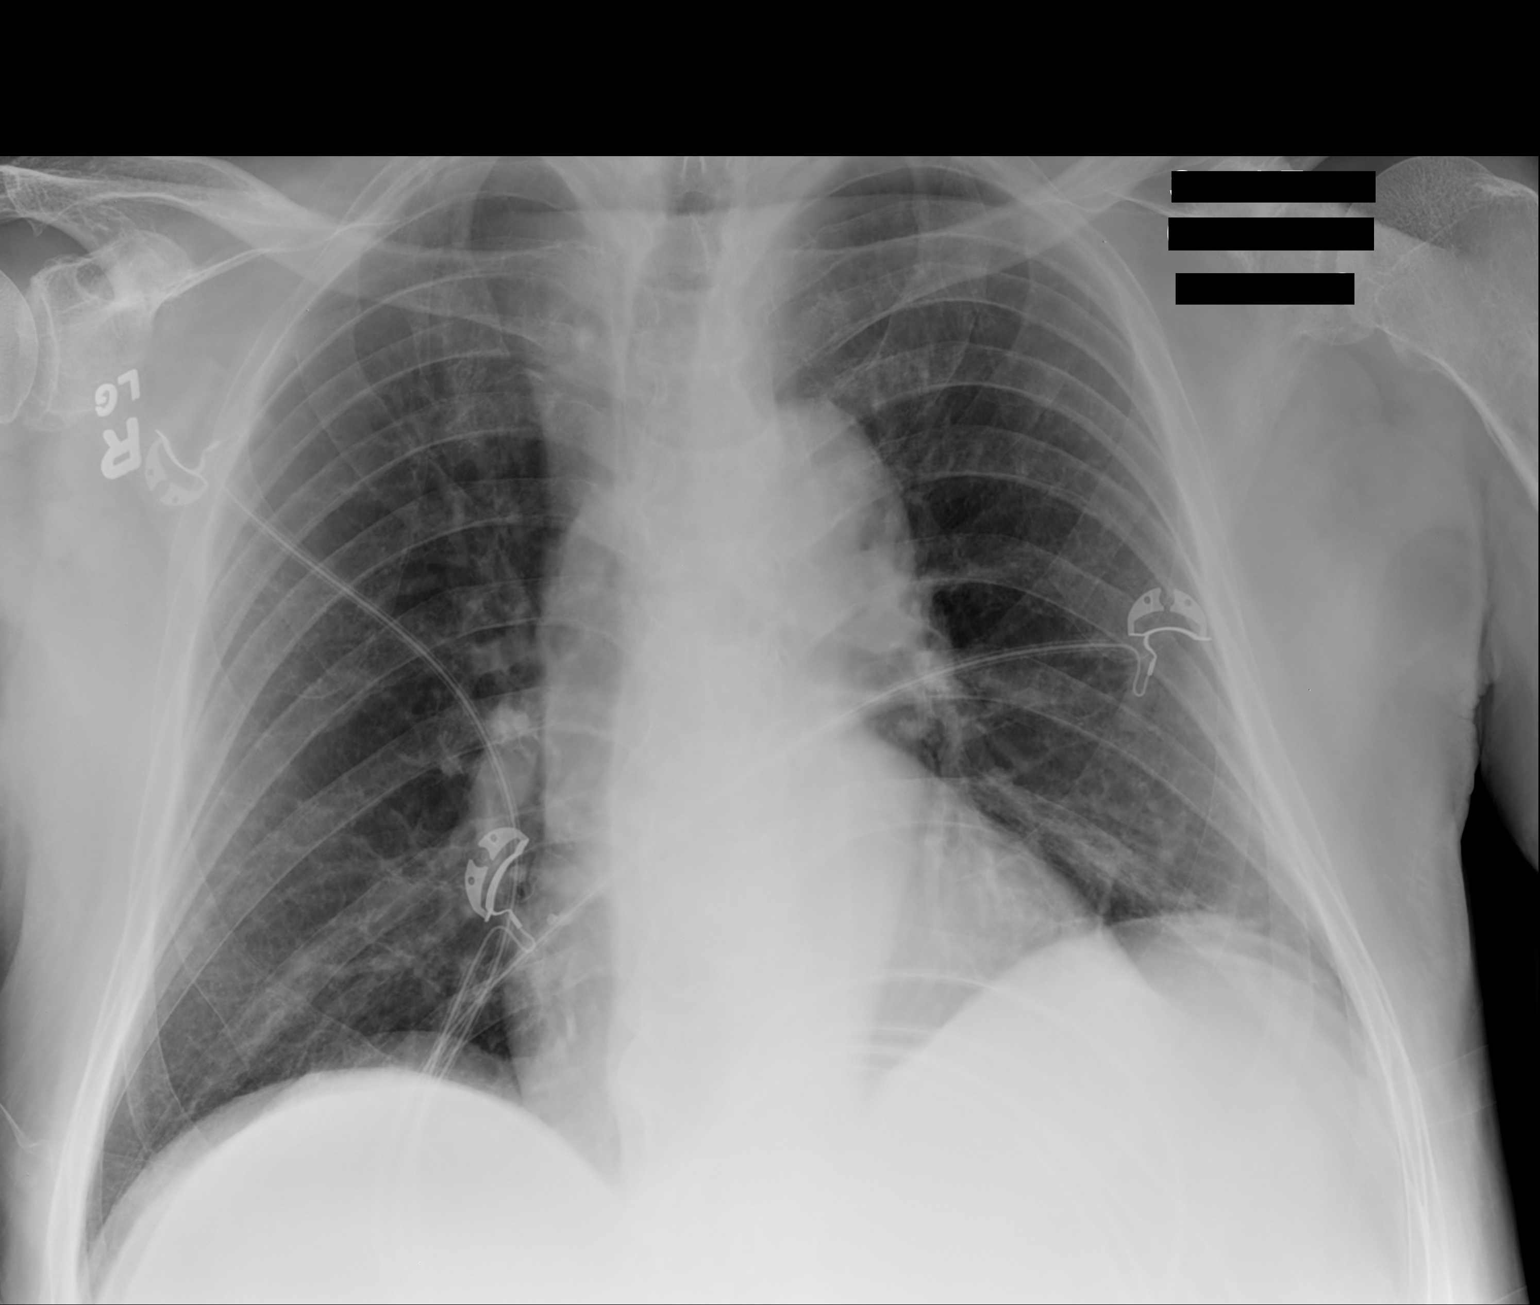

[1 of 1 positions shown; findings below may reference images not displayed]

FINDINGS: Normal heart size, mediastinal contours, and pulmonary vascularity.
Minimal left basilar atelectasis.
Lungs otherwise clear.
No pleural effusion or pneumothorax.
IMPRESSION: Minimal left basilar atelectasis.

## 2012-08-08 DIAGNOSIS — M810 Age-related osteoporosis without current pathological fracture: Secondary | ICD-10-CM | POA: Diagnosis not present

## 2012-08-08 DIAGNOSIS — R972 Elevated prostate specific antigen [PSA]: Secondary | ICD-10-CM | POA: Diagnosis not present

## 2012-08-08 DIAGNOSIS — E785 Hyperlipidemia, unspecified: Secondary | ICD-10-CM | POA: Diagnosis not present

## 2012-08-08 DIAGNOSIS — R82998 Other abnormal findings in urine: Secondary | ICD-10-CM | POA: Diagnosis not present

## 2012-08-08 DIAGNOSIS — E1129 Type 2 diabetes mellitus with other diabetic kidney complication: Secondary | ICD-10-CM | POA: Diagnosis not present

## 2012-08-08 DIAGNOSIS — E039 Hypothyroidism, unspecified: Secondary | ICD-10-CM | POA: Diagnosis not present

## 2012-08-12 DIAGNOSIS — Z1212 Encounter for screening for malignant neoplasm of rectum: Secondary | ICD-10-CM | POA: Diagnosis not present

## 2012-08-14 DIAGNOSIS — R972 Elevated prostate specific antigen [PSA]: Secondary | ICD-10-CM | POA: Diagnosis not present

## 2012-08-14 DIAGNOSIS — N184 Chronic kidney disease, stage 4 (severe): Secondary | ICD-10-CM | POA: Diagnosis not present

## 2012-08-14 DIAGNOSIS — E1129 Type 2 diabetes mellitus with other diabetic kidney complication: Secondary | ICD-10-CM | POA: Diagnosis not present

## 2012-08-14 DIAGNOSIS — Z0000 Encounter for general adult medical examination without abnormal findings: Secondary | ICD-10-CM | POA: Diagnosis not present

## 2013-02-11 DIAGNOSIS — E039 Hypothyroidism, unspecified: Secondary | ICD-10-CM | POA: Diagnosis not present

## 2013-02-11 DIAGNOSIS — I4891 Unspecified atrial fibrillation: Secondary | ICD-10-CM | POA: Diagnosis not present

## 2013-02-11 DIAGNOSIS — I10 Essential (primary) hypertension: Secondary | ICD-10-CM | POA: Diagnosis not present

## 2013-02-11 DIAGNOSIS — R972 Elevated prostate specific antigen [PSA]: Secondary | ICD-10-CM | POA: Diagnosis not present

## 2013-02-11 DIAGNOSIS — N184 Chronic kidney disease, stage 4 (severe): Secondary | ICD-10-CM | POA: Diagnosis not present

## 2013-02-11 DIAGNOSIS — E1129 Type 2 diabetes mellitus with other diabetic kidney complication: Secondary | ICD-10-CM | POA: Diagnosis not present

## 2013-02-11 DIAGNOSIS — E785 Hyperlipidemia, unspecified: Secondary | ICD-10-CM | POA: Diagnosis not present

## 2013-02-11 DIAGNOSIS — M810 Age-related osteoporosis without current pathological fracture: Secondary | ICD-10-CM | POA: Diagnosis not present

## 2013-02-11 DIAGNOSIS — Z1331 Encounter for screening for depression: Secondary | ICD-10-CM | POA: Diagnosis not present

## 2013-02-11 DIAGNOSIS — R809 Proteinuria, unspecified: Secondary | ICD-10-CM | POA: Diagnosis not present

## 2013-02-14 DIAGNOSIS — Z961 Presence of intraocular lens: Secondary | ICD-10-CM | POA: Diagnosis not present

## 2013-02-14 DIAGNOSIS — H3530 Unspecified macular degeneration: Secondary | ICD-10-CM | POA: Diagnosis not present

## 2013-02-14 DIAGNOSIS — E119 Type 2 diabetes mellitus without complications: Secondary | ICD-10-CM | POA: Diagnosis not present

## 2013-02-14 DIAGNOSIS — H1820 Unspecified corneal edema: Secondary | ICD-10-CM | POA: Diagnosis not present

## 2013-04-14 DIAGNOSIS — Z23 Encounter for immunization: Secondary | ICD-10-CM | POA: Diagnosis not present

## 2013-07-29 ENCOUNTER — Encounter: Payer: Self-pay | Admitting: Cardiovascular Disease

## 2013-08-11 DIAGNOSIS — M810 Age-related osteoporosis without current pathological fracture: Secondary | ICD-10-CM | POA: Diagnosis not present

## 2013-08-11 DIAGNOSIS — E1129 Type 2 diabetes mellitus with other diabetic kidney complication: Secondary | ICD-10-CM | POA: Diagnosis not present

## 2013-08-11 DIAGNOSIS — E039 Hypothyroidism, unspecified: Secondary | ICD-10-CM | POA: Diagnosis not present

## 2013-08-11 DIAGNOSIS — R82998 Other abnormal findings in urine: Secondary | ICD-10-CM | POA: Diagnosis not present

## 2013-08-11 DIAGNOSIS — E785 Hyperlipidemia, unspecified: Secondary | ICD-10-CM | POA: Diagnosis not present

## 2013-08-11 DIAGNOSIS — Z125 Encounter for screening for malignant neoplasm of prostate: Secondary | ICD-10-CM | POA: Diagnosis not present

## 2013-08-18 DIAGNOSIS — E039 Hypothyroidism, unspecified: Secondary | ICD-10-CM | POA: Diagnosis not present

## 2013-08-18 DIAGNOSIS — Z0000 Encounter for general adult medical examination without abnormal findings: Secondary | ICD-10-CM | POA: Diagnosis not present

## 2013-08-18 DIAGNOSIS — I10 Essential (primary) hypertension: Secondary | ICD-10-CM | POA: Diagnosis not present

## 2013-08-18 DIAGNOSIS — E1129 Type 2 diabetes mellitus with other diabetic kidney complication: Secondary | ICD-10-CM | POA: Diagnosis not present

## 2013-08-18 DIAGNOSIS — N184 Chronic kidney disease, stage 4 (severe): Secondary | ICD-10-CM | POA: Diagnosis not present

## 2013-08-18 DIAGNOSIS — R809 Proteinuria, unspecified: Secondary | ICD-10-CM | POA: Diagnosis not present

## 2013-08-18 DIAGNOSIS — E785 Hyperlipidemia, unspecified: Secondary | ICD-10-CM | POA: Diagnosis not present

## 2013-08-18 DIAGNOSIS — N401 Enlarged prostate with lower urinary tract symptoms: Secondary | ICD-10-CM | POA: Diagnosis not present

## 2013-08-18 DIAGNOSIS — Z1212 Encounter for screening for malignant neoplasm of rectum: Secondary | ICD-10-CM | POA: Diagnosis not present

## 2013-09-09 DIAGNOSIS — E1129 Type 2 diabetes mellitus with other diabetic kidney complication: Secondary | ICD-10-CM | POA: Diagnosis not present

## 2013-09-09 DIAGNOSIS — N401 Enlarged prostate with lower urinary tract symptoms: Secondary | ICD-10-CM | POA: Diagnosis not present

## 2013-09-09 DIAGNOSIS — Z0000 Encounter for general adult medical examination without abnormal findings: Secondary | ICD-10-CM | POA: Diagnosis not present

## 2013-09-09 DIAGNOSIS — N184 Chronic kidney disease, stage 4 (severe): Secondary | ICD-10-CM | POA: Diagnosis not present

## 2013-09-09 DIAGNOSIS — E039 Hypothyroidism, unspecified: Secondary | ICD-10-CM | POA: Diagnosis not present

## 2013-09-09 DIAGNOSIS — I10 Essential (primary) hypertension: Secondary | ICD-10-CM | POA: Diagnosis not present

## 2013-09-09 DIAGNOSIS — E785 Hyperlipidemia, unspecified: Secondary | ICD-10-CM | POA: Diagnosis not present

## 2013-09-09 DIAGNOSIS — R809 Proteinuria, unspecified: Secondary | ICD-10-CM | POA: Diagnosis not present

## 2013-11-17 DIAGNOSIS — E1129 Type 2 diabetes mellitus with other diabetic kidney complication: Secondary | ICD-10-CM | POA: Diagnosis not present

## 2013-11-17 DIAGNOSIS — R972 Elevated prostate specific antigen [PSA]: Secondary | ICD-10-CM | POA: Diagnosis not present

## 2013-11-17 DIAGNOSIS — E039 Hypothyroidism, unspecified: Secondary | ICD-10-CM | POA: Diagnosis not present

## 2013-11-17 DIAGNOSIS — Z79899 Other long term (current) drug therapy: Secondary | ICD-10-CM | POA: Diagnosis not present

## 2013-11-17 DIAGNOSIS — I10 Essential (primary) hypertension: Secondary | ICD-10-CM | POA: Diagnosis not present

## 2013-11-17 DIAGNOSIS — F321 Major depressive disorder, single episode, moderate: Secondary | ICD-10-CM | POA: Diagnosis not present

## 2013-11-17 DIAGNOSIS — R809 Proteinuria, unspecified: Secondary | ICD-10-CM | POA: Diagnosis not present

## 2013-11-17 DIAGNOSIS — E785 Hyperlipidemia, unspecified: Secondary | ICD-10-CM | POA: Diagnosis not present

## 2013-11-22 ENCOUNTER — Encounter (HOSPITAL_COMMUNITY): Payer: Self-pay | Admitting: Emergency Medicine

## 2013-11-22 ENCOUNTER — Emergency Department (HOSPITAL_COMMUNITY): Payer: Medicare Other

## 2013-11-22 ENCOUNTER — Emergency Department (HOSPITAL_COMMUNITY)
Admission: EM | Admit: 2013-11-22 | Discharge: 2013-11-22 | Disposition: A | Discharge status: Home / Self Care | Payer: Medicare Other | Attending: Emergency Medicine | Admitting: Emergency Medicine

## 2013-11-22 DIAGNOSIS — E785 Hyperlipidemia, unspecified: Secondary | ICD-10-CM | POA: Insufficient documentation

## 2013-11-22 DIAGNOSIS — S0990XA Unspecified injury of head, initial encounter: Secondary | ICD-10-CM

## 2013-11-22 DIAGNOSIS — Z7982 Long term (current) use of aspirin: Secondary | ICD-10-CM | POA: Insufficient documentation

## 2013-11-22 DIAGNOSIS — S42212A Unspecified displaced fracture of surgical neck of left humerus, initial encounter for closed fracture: Secondary | ICD-10-CM

## 2013-11-22 DIAGNOSIS — Z880 Allergy status to penicillin: Secondary | ICD-10-CM | POA: Diagnosis not present

## 2013-11-22 DIAGNOSIS — T148XXA Other injury of unspecified body region, initial encounter: Secondary | ICD-10-CM | POA: Diagnosis not present

## 2013-11-22 DIAGNOSIS — S51009A Unspecified open wound of unspecified elbow, initial encounter: Secondary | ICD-10-CM | POA: Diagnosis not present

## 2013-11-22 DIAGNOSIS — S42213A Unspecified displaced fracture of surgical neck of unspecified humerus, initial encounter for closed fracture: Secondary | ICD-10-CM | POA: Insufficient documentation

## 2013-11-22 DIAGNOSIS — E039 Hypothyroidism, unspecified: Secondary | ICD-10-CM | POA: Insufficient documentation

## 2013-11-22 DIAGNOSIS — Z87891 Personal history of nicotine dependence: Secondary | ICD-10-CM | POA: Diagnosis not present

## 2013-11-22 DIAGNOSIS — Z7902 Long term (current) use of antithrombotics/antiplatelets: Secondary | ICD-10-CM | POA: Insufficient documentation

## 2013-11-22 DIAGNOSIS — Z79899 Other long term (current) drug therapy: Secondary | ICD-10-CM | POA: Insufficient documentation

## 2013-11-22 DIAGNOSIS — IMO0002 Reserved for concepts with insufficient information to code with codable children: Secondary | ICD-10-CM | POA: Insufficient documentation

## 2013-11-22 DIAGNOSIS — Z23 Encounter for immunization: Secondary | ICD-10-CM | POA: Diagnosis not present

## 2013-11-22 DIAGNOSIS — Y939 Activity, unspecified: Secondary | ICD-10-CM | POA: Insufficient documentation

## 2013-11-22 DIAGNOSIS — Z8719 Personal history of other diseases of the digestive system: Secondary | ICD-10-CM | POA: Diagnosis not present

## 2013-11-22 DIAGNOSIS — I10 Essential (primary) hypertension: Secondary | ICD-10-CM | POA: Diagnosis not present

## 2013-11-22 DIAGNOSIS — Y9229 Other specified public building as the place of occurrence of the external cause: Secondary | ICD-10-CM | POA: Insufficient documentation

## 2013-11-22 DIAGNOSIS — S0180XA Unspecified open wound of other part of head, initial encounter: Secondary | ICD-10-CM | POA: Diagnosis not present

## 2013-11-22 DIAGNOSIS — M25519 Pain in unspecified shoulder: Secondary | ICD-10-CM | POA: Diagnosis not present

## 2013-11-22 DIAGNOSIS — E119 Type 2 diabetes mellitus without complications: Secondary | ICD-10-CM | POA: Insufficient documentation

## 2013-11-22 DIAGNOSIS — S0993XA Unspecified injury of face, initial encounter: Secondary | ICD-10-CM | POA: Diagnosis not present

## 2013-11-22 DIAGNOSIS — W19XXXA Unspecified fall, initial encounter: Secondary | ICD-10-CM

## 2013-11-22 HISTORY — DX: Disorder of kidney and ureter, unspecified: N28.9

## 2013-11-22 HISTORY — DX: Gastro-esophageal reflux disease without esophagitis: K21.9

## 2013-11-22 MED ORDER — MORPHINE SULFATE 4 MG/ML IJ SOLN
4.0000 mg | Freq: Once | INTRAMUSCULAR | Status: AC
  Administered 2013-11-22: 4 mg via INTRAVENOUS
  Filled 2013-11-22: qty 1

## 2013-11-22 MED ORDER — TETANUS-DIPHTH-ACELL PERTUSSIS 5-2.5-18.5 LF-MCG/0.5 IM SUSP
0.5000 mL | Freq: Once | INTRAMUSCULAR | Status: AC
  Administered 2013-11-22: 0.5 mL via INTRAMUSCULAR
  Filled 2013-11-22: qty 0.5

## 2013-11-22 MED ORDER — FENTANYL CITRATE 0.05 MG/ML IJ SOLN
50.0000 ug | Freq: Once | INTRAMUSCULAR | Status: AC
  Administered 2013-11-22: 50 ug via INTRAVENOUS
  Filled 2013-11-22: qty 2

## 2013-11-22 MED ORDER — OXYCODONE-ACETAMINOPHEN 5-325 MG PO TABS: 1.0000 | ORAL_TABLET | Freq: Four times a day (QID) | ORAL | Status: DC | PRN

## 2013-11-22 MED ORDER — DOCUSATE SODIUM 100 MG PO CAPS: 100.0000 mg | ORAL_CAPSULE | Freq: Two times a day (BID) | ORAL | Status: DC

## 2013-11-22 NOTE — ED Provider Notes (Addendum)
TIME SEEN: 12:46 PM  CHIEF COMPLAINT: Fall  HPI: Patient is a 80 year old male with history of atrial fibrillation no longer on anticoagulation, diabetes, hypertension, hyperlipidemia, hypothyroidism who presents to the emergency department after a mechanical fall. He reports that he was in a store and missed a step and fell forward hitting his face and left shoulder. He is left-hand dominant. Denies any numbness or focal weakness. Is not sure when his last tetanus shot was. Denies any chest pain, shortness of breath, palpitations or dizziness that led to his fall.  ROS: See HPI Constitutional: no fever  Eyes: no drainage  ENT: no runny nose   Cardiovascular:  no chest pain  Resp: no SOB  GI: no vomiting GU: no dysuria Integumentary: no rash  Allergy: no hives  Musculoskeletal: no leg swelling  Neurological: no slurred speech ROS otherwise negative  PAST MEDICAL HISTORY/PAST SURGICAL HISTORY:  Past Medical History  Diagnosis Date  . AF (atrial fibrillation)   . DM (diabetes mellitus)   . Hypertension   . Hyperlipemia   . Hypothyroidism   . GERD (gastroesophageal reflux disease)   . Renal disorder     renal lithiasis    MEDICATIONS:  Prior to Admission medications   Medication Sig Start Date End Date Taking? Authorizing Provider  aspirin 325 MG tablet Take 325 mg by mouth daily.      Historical Provider, MD  Dabigatran Etexilate Mesylate (PRADAXA) 150 MG CAPS Take 150 mg by mouth every 12 (twelve) hours.  01/14/10   Historical Provider, MD  diltiazem (TIAZAC) 240 MG 24 hr capsule Take 240 mg by mouth daily.      Historical Provider, MD  fenofibrate (TRICOR) 48 MG tablet Take 48 mg by mouth daily.      Historical Provider, MD  glipiZIDE (GLUCOTROL) 10 MG 24 hr tablet Take 10 mg by mouth daily.      Historical Provider, MD  levothyroxine (SYNTHROID, LEVOTHROID) 75 MCG tablet Take 75 mcg by mouth daily.      Historical Provider, MD  lisinopril (PRINIVIL,ZESTRIL) 40 MG tablet Take  40 mg by mouth daily.      Historical Provider, MD  losartan-hydrochlorothiazide (HYZAAR) 100-25 MG per tablet Take 1 tablet by mouth daily.      Historical Provider, MD  Multiple Vitamins-Minerals (CENTRUM SILVER PO) Take by mouth daily.      Historical Provider, MD  pravastatin (PRAVACHOL) 10 MG tablet Take 10 mg by mouth daily.      Historical Provider, MD  Tamsulosin HCl (FLOMAX) 0.4 MG CAPS Take 0.4 mg by mouth daily.      Historical Provider, MD    ALLERGIES:  Allergies  Allergen Reactions  . Penicillins Hives    SOCIAL HISTORY:  History  Substance Use Topics  . Smoking status: Former Research scientist (life sciences)  . Smokeless tobacco: Not on file  . Alcohol Use: No    FAMILY HISTORY: Family History  Problem Relation Age of Onset  . Stroke Father     DECEASED  . Cancer Mother     DECEASED  . Heart attack Brother     DECEASED  . Coronary artery disease Brother 68    CABG  . Alzheimer's disease Sister     DECEASED    EXAM: BP 147/72  Pulse 70  Temp(Src) 97.9 F (36.6 C) (Oral)  Resp 13  SpO2 99% CONSTITUTIONAL: Alert and oriented and responds appropriately to questions. Appears uncomfortable, GCS 15 HEAD: Normocephalic; superficial 3 cm laceration lateral to the left eye  EYES: Conjunctivae clear, PERRL, EOMI ENT: normal nose; no rhinorrhea; moist mucous membranes; pharynx without lesions noted; no dental injury; patient has dry blood in bilateral nares but no septal hematoma NECK: Supple, no meningismus, no LAD; no midline spinal tenderness, step-off or deformity CARD: RRR; S1 and S2 appreciated; no murmurs, no clicks, no rubs, no gallops RESP: Normal chest excursion without splinting or tachypnea; breath sounds clear and equal bilaterally; no wheezes, no rhonchi, no rales; chest wall stable, nontender to palpation ABD/GI: Normal bowel sounds; non-distended; soft, non-tender, no rebound, no guarding PELVIS:  stable, nontender to palpation BACK:  The back appears normal and is  non-tender to palpation, there is no CVA tenderness; no midline spinal tenderness, step-off or deformity EXT: He has a significant amount of swelling and tenderness to his anterior left shoulder but normal grip strength, 2+ radial pulses bilaterally, sensation to light touch intact diffusely, no tenderness to palpation in the left hand or wrist or forearm or elbow, otherwise Normal ROM in all joints; otherwise extremities are non-tender to palpation; no edema; normal capillary refill; no cyanosis    SKIN: Normal color for age and race; warm; 4 cm skin tear to the right elbow NEURO: Moves all extremities equally; cranial nerves II through XII intact, sensation to light touch intact diffusely PSYCH: The patient's mood and manner are appropriate. Grooming and personal hygiene are appropriate.  MEDICAL DECISION MAKING: Patient here with mechanical fall with head injury, facial injuries and left shoulder injury. We'll give pain medication, update tetanus, repair wounds and obtain CT imaging of his head and cervical spine as well as his face and x-rays of his left shoulder.  ED PROGRESS: Patient's CT of his head, cervical spine and fascia no acute injury. He has a comminuted and impacted she will fracture on the left side. Will place on sliding. We'll give IV morphine for pain control as sentinel has not been helping. He is still hemodynamically stable. Will repair his laceration on his face using Dermabond. He has a skin tear to his right elbow that is not amenable to sutures. Will clean this wound, applied bacitracin and dress. Will give patient followup information with orthopedics. Have discussed return precautions and supportive care instructions. Patient and family at bedside verbalize understanding and are comfortable with plan.   LACERATION REPAIR Performed by: Delice Bison Sabria Florido Authorized by: Delice Bison Leeya Rusconi Consent: Verbal consent obtained. Risks and benefits: risks, benefits and alternatives were  discussed Consent given by: patient Patient identity confirmed: provided demographic data Prepped and Draped in normal sterile fashion Wound explored  Laceration Location: Left face  Laceration Length: 3 cm  No Foreign Bodies seen or palpated  Anesthesia: local infiltration  Local anesthetic: None   Anesthetic total: 0 ml  Irrigation method: syringe Amount of cleaning: standard  Skin closure: Dermabond   Number of sutures: 0   Technique: Area cleaned using alcohol swabs and with saline. Dermabond applied.   Patient tolerance: Patient tolerated the procedure well with no immediate complications.  SPLINT APPLICATION Date/Time: 123XX123 PM Authorized by: Delice Bison Jacere Pangborn Consent: Verbal consent obtained. Risks and benefits: risks, benefits and alternatives were discussed Consent given by: patient Splint applied by: orthopedic technician Location details: left arm Splint type: Sling Supplies used: sling Post-procedure: The splinted body part was neurovascularly unchanged following the procedure. Patient tolerance: Patient tolerated the procedure well with no immediate complications.       EKG Interpretation  Date/Time:  Saturday Nov 22 2013 12:40:13 EDT Ventricular Rate:  74 PR Interval:    QRS Duration: 95 QT Interval:  420 QTC Calculation: 466 R Axis:   107 Text Interpretation:  Atrial fibrillation Paired ventricular premature complexes Right axis deviation Borderline low voltage, extremity leads Anteroseptal infarct, old No significant change since last tracing Confirmed by Vue Pavon,  DO, Khyren Hing [phone]) on 11/22/2013 12:46:32 PM          [place], DO 11/22/13 [place], DO 11/22/13 [id number]

## 2013-11-22 NOTE — Discharge Instructions (Signed)
Head Injury, Adult °You have received a head injury. It does not appear serious at this time. Headaches and vomiting are common following head injury. It should be easy to awaken from sleeping. Sometimes it is necessary for you to stay in the emergency department for a while for observation. Sometimes admission to the hospital may be needed. After injuries such as yours, most problems occur within the first 24 hours, but side effects may occur up to 7 10 days after the injury. It is important for you to carefully monitor your condition and contact your health care provider or seek immediate medical care if there is a change in your condition. °WHAT ARE THE TYPES OF HEAD INJURIES? °Head injuries can be as minor as a bump. Some head injuries can be more severe. More severe head injuries include: °· A jarring injury to the brain (concussion). °· A bruise of the brain (contusion). This mean there is bleeding in the brain that can cause swelling. °· A cracked skull (skull fracture). °· Bleeding in the brain that collects, clots, and forms a bump (hematoma). °WHAT CAUSES A HEAD INJURY? °A serious head injury is most likely to happen to someone who is in a car wreck and is not wearing a seat belt. Other causes of major head injuries include bicycle or motorcycle accidents, sports injuries, and falls. °HOW ARE HEAD INJURIES DIAGNOSED? °A complete history of the event leading to the injury and your current symptoms will be helpful in diagnosing head injuries. Many times, pictures of the brain, such as CT or MRI are needed to see the extent of the injury. Often, an overnight hospital stay is necessary for observation.  °WHEN SHOULD I SEEK IMMEDIATE MEDICAL CARE?  °You should get help right away if: °· You have confusion or drowsiness. °· You feel sick to your stomach (nauseous) or have continued, forceful vomiting. °· You have dizziness or unsteadiness that is getting worse. °· You have severe, continued headaches not  relieved by medicine. Only take over-the-counter or prescription medicines for pain, fever, or discomfort as directed by your health care provider. °· You do not have normal function of the arms or legs or are unable to walk. °· You notice changes in the black spots in the center of the colored part of your eye (pupil). °· You have a clear or bloody fluid coming from your nose or ears. °· You have a loss of vision. °During the next 24 hours after the injury, you must stay with someone who can watch you for the warning signs. This person should contact local emergency services (911 in the [place] if you have seizures, you become unconscious, or you are unable to wake up. °HOW CAN I PREVENT A HEAD INJURY IN THE FUTURE? °The most important factor for preventing major head injuries is avoiding motor vehicle accidents.  To minimize the potential for damage to your head, it is crucial to wear seat belts while riding in motor vehicles. Wearing helmets while bike riding and playing collision sports (like football) is also helpful. Also, avoiding dangerous activities around the house will further help reduce your risk of head injury.  °WHEN CAN I RETURN TO NORMAL ACTIVITIES AND ATHLETICS? °You should be reevaluated by your health care provider before returning to these activities. If you have any of the following symptoms, you should not return to activities or contact sports until 1 week after the symptoms have stopped: °· Persistent headache. °· Dizziness or vertigo. °· Poor attention and concentration. °·   Confusion.  Memory problems.  Nausea or vomiting.  Fatigue or tire easily.  Irritability.  Intolerant of bright lights or loud noises.  Anxiety or depression.  Disturbed sleep. MAKE SURE YOU:   Understand these instructions.  Will watch your condition.  Will get help right away if you are not doing well or get worse. Document Released: 06/26/2005 Document Revised: 04/16/2013 Document Reviewed:  03/03/2013 [facility] Patient Information 2014 [place].  Humerus Fracture, Treated with Immobilization The humerus is the large bone in your upper arm. You have a broken (fractured) humerus. These fractures are easily diagnosed with X-rays. TREATMENT  Simple fractures which will heal without disability are treated with simple immobilization. Immobilization means you will wear a cast, splint, or sling. You have a fracture which will do well with immobilization. The fracture will heal well simply by being held in a good position until it is stable enough to begin range of motion exercises. Do not take part in activities which would further injure your arm.  HOME CARE INSTRUCTIONS   Put ice on the injured area.  Put ice in a plastic bag.  Place a towel between your skin and the bag.  Leave the ice on for 15-20 minutes, 03-04 times a day.  If you have a cast:  Do not scratch the skin under the cast using sharp or pointed objects.  Check the skin around the cast every day. You may put lotion on any red or sore areas.  Keep your cast dry and clean.  If you have a splint:  Wear the splint as directed.  Keep your splint dry and clean.  You may loosen the elastic around the splint if your fingers become numb, tingle, or turn cold or blue.  If you have a sling:  Wear the sling as directed.  Do not put pressure on any part of your cast or splint until it is fully hardened.  Your cast or splint can be protected during bathing with a plastic bag. Do not lower the cast or splint into water.  Only take over-the-counter or prescription medicines for pain, discomfort, or fever as directed by your caregiver.  Do range of motion exercises as instructed by your caregiver.  Follow up as directed by your caregiver. This is very important in order to avoid permanent injury or disability and chronic pain. SEEK IMMEDIATE MEDICAL CARE IF:   Your skin or nails in the injured arm turn blue  or gray.  Your arm feels cold or numb.  You develop severe pain in the injured arm.  You are having problems with the medicines you were given. MAKE SURE YOU:   Understand these instructions.  Will watch your condition.  Will get help right away if you are not doing well or get worse. Document Released: 10/02/2000 Document Revised: 09/18/2011 Document Reviewed: 08/10/2010 [facility] Patient Information 2014 [place].

## 2013-11-22 NOTE — ED Notes (Addendum)
Pt picked up from gardening store after missing step and falling on face.  No loc.  No hx of blood thinners. C/o L shoulder pain, crepitus and decreased rom. Lac to L eye and R elbow.  AO x 4.  EKG showed pvc's with 1st degree block.  150 mcg fentanyl given.

## 2013-12-22 DIAGNOSIS — Z79899 Other long term (current) drug therapy: Secondary | ICD-10-CM | POA: Diagnosis not present

## 2013-12-22 DIAGNOSIS — E785 Hyperlipidemia, unspecified: Secondary | ICD-10-CM | POA: Diagnosis not present

## 2013-12-22 DIAGNOSIS — E039 Hypothyroidism, unspecified: Secondary | ICD-10-CM | POA: Diagnosis not present

## 2013-12-22 DIAGNOSIS — I10 Essential (primary) hypertension: Secondary | ICD-10-CM | POA: Diagnosis not present

## 2013-12-22 DIAGNOSIS — E1129 Type 2 diabetes mellitus with other diabetic kidney complication: Secondary | ICD-10-CM | POA: Diagnosis not present

## 2013-12-22 DIAGNOSIS — F321 Major depressive disorder, single episode, moderate: Secondary | ICD-10-CM | POA: Diagnosis not present

## 2013-12-22 DIAGNOSIS — M810 Age-related osteoporosis without current pathological fracture: Secondary | ICD-10-CM | POA: Diagnosis not present

## 2013-12-22 DIAGNOSIS — N401 Enlarged prostate with lower urinary tract symptoms: Secondary | ICD-10-CM | POA: Diagnosis not present

## 2013-12-22 DIAGNOSIS — N138 Other obstructive and reflux uropathy: Secondary | ICD-10-CM | POA: Diagnosis not present

## 2013-12-25 DIAGNOSIS — M25519 Pain in unspecified shoulder: Secondary | ICD-10-CM | POA: Diagnosis not present

## 2014-02-12 DIAGNOSIS — M25519 Pain in unspecified shoulder: Secondary | ICD-10-CM | POA: Diagnosis not present

## 2014-02-18 DIAGNOSIS — M810 Age-related osteoporosis without current pathological fracture: Secondary | ICD-10-CM | POA: Diagnosis not present

## 2014-02-18 DIAGNOSIS — E1129 Type 2 diabetes mellitus with other diabetic kidney complication: Secondary | ICD-10-CM | POA: Diagnosis not present

## 2014-02-18 DIAGNOSIS — E039 Hypothyroidism, unspecified: Secondary | ICD-10-CM | POA: Diagnosis not present

## 2014-02-18 DIAGNOSIS — E785 Hyperlipidemia, unspecified: Secondary | ICD-10-CM | POA: Diagnosis not present

## 2014-02-18 DIAGNOSIS — Z1331 Encounter for screening for depression: Secondary | ICD-10-CM | POA: Diagnosis not present

## 2014-02-18 DIAGNOSIS — Z6825 Body mass index (BMI) 25.0-25.9, adult: Secondary | ICD-10-CM | POA: Diagnosis not present

## 2014-03-04 DIAGNOSIS — Z961 Presence of intraocular lens: Secondary | ICD-10-CM | POA: Diagnosis not present

## 2014-03-04 DIAGNOSIS — H5200 Hypermetropia, unspecified eye: Secondary | ICD-10-CM | POA: Diagnosis not present

## 2014-03-04 DIAGNOSIS — H1820 Unspecified corneal edema: Secondary | ICD-10-CM | POA: Diagnosis not present

## 2014-03-04 DIAGNOSIS — H548 Legal blindness, as defined in USA: Secondary | ICD-10-CM | POA: Diagnosis not present

## 2014-03-04 DIAGNOSIS — H35369 Drusen (degenerative) of macula, unspecified eye: Secondary | ICD-10-CM | POA: Diagnosis not present

## 2014-03-04 DIAGNOSIS — E119 Type 2 diabetes mellitus without complications: Secondary | ICD-10-CM | POA: Diagnosis not present

## 2014-03-04 DIAGNOSIS — H01009 Unspecified blepharitis unspecified eye, unspecified eyelid: Secondary | ICD-10-CM | POA: Diagnosis not present

## 2014-04-01 DIAGNOSIS — Z23 Encounter for immunization: Secondary | ICD-10-CM | POA: Diagnosis not present

## 2014-04-15 DIAGNOSIS — M810 Age-related osteoporosis without current pathological fracture: Secondary | ICD-10-CM | POA: Diagnosis not present

## 2014-04-15 DIAGNOSIS — N184 Chronic kidney disease, stage 4 (severe): Secondary | ICD-10-CM | POA: Diagnosis not present

## 2014-05-21 DIAGNOSIS — F321 Major depressive disorder, single episode, moderate: Secondary | ICD-10-CM | POA: Diagnosis not present

## 2014-05-21 DIAGNOSIS — E785 Hyperlipidemia, unspecified: Secondary | ICD-10-CM | POA: Diagnosis not present

## 2014-05-21 DIAGNOSIS — Z79899 Other long term (current) drug therapy: Secondary | ICD-10-CM | POA: Diagnosis not present

## 2014-05-21 DIAGNOSIS — I10 Essential (primary) hypertension: Secondary | ICD-10-CM | POA: Diagnosis not present

## 2014-05-21 DIAGNOSIS — E1129 Type 2 diabetes mellitus with other diabetic kidney complication: Secondary | ICD-10-CM | POA: Diagnosis not present

## 2014-05-21 DIAGNOSIS — E039 Hypothyroidism, unspecified: Secondary | ICD-10-CM | POA: Diagnosis not present

## 2014-05-21 DIAGNOSIS — N401 Enlarged prostate with lower urinary tract symptoms: Secondary | ICD-10-CM | POA: Diagnosis not present

## 2014-05-21 DIAGNOSIS — R809 Proteinuria, unspecified: Secondary | ICD-10-CM | POA: Diagnosis not present

## 2014-06-30 DIAGNOSIS — Z6825 Body mass index (BMI) 25.0-25.9, adult: Secondary | ICD-10-CM | POA: Diagnosis not present

## 2014-06-30 DIAGNOSIS — R21 Rash and other nonspecific skin eruption: Secondary | ICD-10-CM | POA: Diagnosis not present

## 2014-06-30 DIAGNOSIS — L309 Dermatitis, unspecified: Secondary | ICD-10-CM | POA: Diagnosis not present

## 2014-07-13 DIAGNOSIS — L2089 Other atopic dermatitis: Secondary | ICD-10-CM | POA: Diagnosis not present

## 2014-08-19 DIAGNOSIS — Z6825 Body mass index (BMI) 25.0-25.9, adult: Secondary | ICD-10-CM | POA: Diagnosis not present

## 2014-08-19 DIAGNOSIS — R809 Proteinuria, unspecified: Secondary | ICD-10-CM | POA: Diagnosis not present

## 2014-08-19 DIAGNOSIS — E1129 Type 2 diabetes mellitus with other diabetic kidney complication: Secondary | ICD-10-CM | POA: Diagnosis not present

## 2014-08-19 DIAGNOSIS — I480 Paroxysmal atrial fibrillation: Secondary | ICD-10-CM | POA: Diagnosis not present

## 2014-08-19 DIAGNOSIS — N184 Chronic kidney disease, stage 4 (severe): Secondary | ICD-10-CM | POA: Diagnosis not present

## 2014-08-19 DIAGNOSIS — M810 Age-related osteoporosis without current pathological fracture: Secondary | ICD-10-CM | POA: Diagnosis not present

## 2014-08-19 DIAGNOSIS — R972 Elevated prostate specific antigen [PSA]: Secondary | ICD-10-CM | POA: Diagnosis not present

## 2014-10-13 DIAGNOSIS — M810 Age-related osteoporosis without current pathological fracture: Secondary | ICD-10-CM | POA: Diagnosis not present

## 2014-10-13 DIAGNOSIS — Z6825 Body mass index (BMI) 25.0-25.9, adult: Secondary | ICD-10-CM | POA: Diagnosis not present

## 2014-11-18 DIAGNOSIS — R109 Unspecified abdominal pain: Secondary | ICD-10-CM | POA: Diagnosis not present

## 2014-11-18 DIAGNOSIS — Z6825 Body mass index (BMI) 25.0-25.9, adult: Secondary | ICD-10-CM | POA: Diagnosis not present

## 2014-11-18 DIAGNOSIS — E1129 Type 2 diabetes mellitus with other diabetic kidney complication: Secondary | ICD-10-CM | POA: Diagnosis not present

## 2014-11-18 DIAGNOSIS — N184 Chronic kidney disease, stage 4 (severe): Secondary | ICD-10-CM | POA: Diagnosis not present

## 2014-11-18 DIAGNOSIS — F321 Major depressive disorder, single episode, moderate: Secondary | ICD-10-CM | POA: Diagnosis not present

## 2014-11-18 DIAGNOSIS — I10 Essential (primary) hypertension: Secondary | ICD-10-CM | POA: Diagnosis not present

## 2014-11-18 DIAGNOSIS — I480 Paroxysmal atrial fibrillation: Secondary | ICD-10-CM | POA: Diagnosis not present

## 2014-11-18 DIAGNOSIS — R809 Proteinuria, unspecified: Secondary | ICD-10-CM | POA: Diagnosis not present

## 2014-11-18 DIAGNOSIS — E785 Hyperlipidemia, unspecified: Secondary | ICD-10-CM | POA: Diagnosis not present

## 2014-11-18 DIAGNOSIS — E039 Hypothyroidism, unspecified: Secondary | ICD-10-CM | POA: Diagnosis not present

## 2015-02-19 DIAGNOSIS — Z1389 Encounter for screening for other disorder: Secondary | ICD-10-CM | POA: Diagnosis not present

## 2015-02-19 DIAGNOSIS — I10 Essential (primary) hypertension: Secondary | ICD-10-CM | POA: Diagnosis not present

## 2015-02-19 DIAGNOSIS — R809 Proteinuria, unspecified: Secondary | ICD-10-CM | POA: Diagnosis not present

## 2015-02-19 DIAGNOSIS — N184 Chronic kidney disease, stage 4 (severe): Secondary | ICD-10-CM | POA: Diagnosis not present

## 2015-02-19 DIAGNOSIS — E785 Hyperlipidemia, unspecified: Secondary | ICD-10-CM | POA: Diagnosis not present

## 2015-02-19 DIAGNOSIS — E1129 Type 2 diabetes mellitus with other diabetic kidney complication: Secondary | ICD-10-CM | POA: Diagnosis not present

## 2015-02-19 DIAGNOSIS — I480 Paroxysmal atrial fibrillation: Secondary | ICD-10-CM | POA: Diagnosis not present

## 2015-02-19 DIAGNOSIS — E039 Hypothyroidism, unspecified: Secondary | ICD-10-CM | POA: Diagnosis not present

## 2015-02-19 DIAGNOSIS — F321 Major depressive disorder, single episode, moderate: Secondary | ICD-10-CM | POA: Diagnosis not present

## 2015-02-19 DIAGNOSIS — Z6825 Body mass index (BMI) 25.0-25.9, adult: Secondary | ICD-10-CM | POA: Diagnosis not present

## 2015-03-24 DIAGNOSIS — Z961 Presence of intraocular lens: Secondary | ICD-10-CM | POA: Diagnosis not present

## 2015-03-24 DIAGNOSIS — H1820 Unspecified corneal edema: Secondary | ICD-10-CM | POA: Diagnosis not present

## 2015-03-24 DIAGNOSIS — H548 Legal blindness, as defined in USA: Secondary | ICD-10-CM | POA: Diagnosis not present

## 2015-03-24 DIAGNOSIS — E119 Type 2 diabetes mellitus without complications: Secondary | ICD-10-CM | POA: Diagnosis not present

## 2015-04-02 DIAGNOSIS — Z23 Encounter for immunization: Secondary | ICD-10-CM | POA: Diagnosis not present

## 2015-05-21 DIAGNOSIS — Z6825 Body mass index (BMI) 25.0-25.9, adult: Secondary | ICD-10-CM | POA: Diagnosis not present

## 2015-05-21 DIAGNOSIS — E039 Hypothyroidism, unspecified: Secondary | ICD-10-CM | POA: Diagnosis not present

## 2015-05-21 DIAGNOSIS — E1129 Type 2 diabetes mellitus with other diabetic kidney complication: Secondary | ICD-10-CM | POA: Diagnosis not present

## 2015-05-21 DIAGNOSIS — I480 Paroxysmal atrial fibrillation: Secondary | ICD-10-CM | POA: Diagnosis not present

## 2015-05-21 DIAGNOSIS — N401 Enlarged prostate with lower urinary tract symptoms: Secondary | ICD-10-CM | POA: Diagnosis not present

## 2015-05-21 DIAGNOSIS — R808 Other proteinuria: Secondary | ICD-10-CM | POA: Diagnosis not present

## 2015-05-21 DIAGNOSIS — Z5181 Encounter for therapeutic drug level monitoring: Secondary | ICD-10-CM | POA: Diagnosis not present

## 2015-05-21 DIAGNOSIS — R972 Elevated prostate specific antigen [PSA]: Secondary | ICD-10-CM | POA: Diagnosis not present

## 2015-05-21 DIAGNOSIS — N184 Chronic kidney disease, stage 4 (severe): Secondary | ICD-10-CM | POA: Diagnosis not present

## 2015-05-21 DIAGNOSIS — Z1389 Encounter for screening for other disorder: Secondary | ICD-10-CM | POA: Diagnosis not present

## 2015-05-21 DIAGNOSIS — F321 Major depressive disorder, single episode, moderate: Secondary | ICD-10-CM | POA: Diagnosis not present

## 2015-05-21 DIAGNOSIS — E784 Other hyperlipidemia: Secondary | ICD-10-CM | POA: Diagnosis not present

## 2015-08-24 DIAGNOSIS — Z6825 Body mass index (BMI) 25.0-25.9, adult: Secondary | ICD-10-CM | POA: Diagnosis not present

## 2015-08-24 DIAGNOSIS — S42302D Unspecified fracture of shaft of humerus, left arm, subsequent encounter for fracture with routine healing: Secondary | ICD-10-CM | POA: Diagnosis not present

## 2015-08-24 DIAGNOSIS — E038 Other specified hypothyroidism: Secondary | ICD-10-CM | POA: Diagnosis not present

## 2015-08-24 DIAGNOSIS — I10 Essential (primary) hypertension: Secondary | ICD-10-CM | POA: Diagnosis not present

## 2015-08-24 DIAGNOSIS — I480 Paroxysmal atrial fibrillation: Secondary | ICD-10-CM | POA: Diagnosis not present

## 2015-08-24 DIAGNOSIS — N184 Chronic kidney disease, stage 4 (severe): Secondary | ICD-10-CM | POA: Diagnosis not present

## 2015-08-24 DIAGNOSIS — Z1389 Encounter for screening for other disorder: Secondary | ICD-10-CM | POA: Diagnosis not present

## 2015-08-24 DIAGNOSIS — M810 Age-related osteoporosis without current pathological fracture: Secondary | ICD-10-CM | POA: Diagnosis not present

## 2015-08-24 DIAGNOSIS — N401 Enlarged prostate with lower urinary tract symptoms: Secondary | ICD-10-CM | POA: Diagnosis not present

## 2015-08-24 DIAGNOSIS — E1129 Type 2 diabetes mellitus with other diabetic kidney complication: Secondary | ICD-10-CM | POA: Diagnosis not present

## 2015-08-24 DIAGNOSIS — F321 Major depressive disorder, single episode, moderate: Secondary | ICD-10-CM | POA: Diagnosis not present

## 2015-08-24 DIAGNOSIS — R972 Elevated prostate specific antigen [PSA]: Secondary | ICD-10-CM | POA: Diagnosis not present

## 2015-11-23 DIAGNOSIS — N401 Enlarged prostate with lower urinary tract symptoms: Secondary | ICD-10-CM | POA: Diagnosis not present

## 2015-11-23 DIAGNOSIS — Z1389 Encounter for screening for other disorder: Secondary | ICD-10-CM | POA: Diagnosis not present

## 2015-11-23 DIAGNOSIS — N184 Chronic kidney disease, stage 4 (severe): Secondary | ICD-10-CM | POA: Diagnosis not present

## 2015-11-23 DIAGNOSIS — R808 Other proteinuria: Secondary | ICD-10-CM | POA: Diagnosis not present

## 2015-11-23 DIAGNOSIS — E1129 Type 2 diabetes mellitus with other diabetic kidney complication: Secondary | ICD-10-CM | POA: Diagnosis not present

## 2015-11-23 DIAGNOSIS — F321 Major depressive disorder, single episode, moderate: Secondary | ICD-10-CM | POA: Diagnosis not present

## 2015-11-23 DIAGNOSIS — I129 Hypertensive chronic kidney disease with stage 1 through stage 4 chronic kidney disease, or unspecified chronic kidney disease: Secondary | ICD-10-CM | POA: Diagnosis not present

## 2015-11-23 DIAGNOSIS — Z6825 Body mass index (BMI) 25.0-25.9, adult: Secondary | ICD-10-CM | POA: Diagnosis not present

## 2015-11-23 DIAGNOSIS — I10 Essential (primary) hypertension: Secondary | ICD-10-CM | POA: Diagnosis not present

## 2015-11-23 DIAGNOSIS — M810 Age-related osteoporosis without current pathological fracture: Secondary | ICD-10-CM | POA: Diagnosis not present

## 2015-11-23 DIAGNOSIS — E038 Other specified hypothyroidism: Secondary | ICD-10-CM | POA: Diagnosis not present

## 2015-11-23 DIAGNOSIS — I480 Paroxysmal atrial fibrillation: Secondary | ICD-10-CM | POA: Diagnosis not present

## 2016-03-03 DIAGNOSIS — I129 Hypertensive chronic kidney disease with stage 1 through stage 4 chronic kidney disease, or unspecified chronic kidney disease: Secondary | ICD-10-CM | POA: Diagnosis not present

## 2016-03-03 DIAGNOSIS — E038 Other specified hypothyroidism: Secondary | ICD-10-CM | POA: Diagnosis not present

## 2016-03-03 DIAGNOSIS — I10 Essential (primary) hypertension: Secondary | ICD-10-CM | POA: Diagnosis not present

## 2016-03-03 DIAGNOSIS — R972 Elevated prostate specific antigen [PSA]: Secondary | ICD-10-CM | POA: Diagnosis not present

## 2016-03-03 DIAGNOSIS — E7800 Pure hypercholesterolemia, unspecified: Secondary | ICD-10-CM | POA: Diagnosis not present

## 2016-03-03 DIAGNOSIS — Z23 Encounter for immunization: Secondary | ICD-10-CM | POA: Diagnosis not present

## 2016-03-03 DIAGNOSIS — R808 Other proteinuria: Secondary | ICD-10-CM | POA: Diagnosis not present

## 2016-03-03 DIAGNOSIS — E1129 Type 2 diabetes mellitus with other diabetic kidney complication: Secondary | ICD-10-CM | POA: Diagnosis not present

## 2016-03-03 DIAGNOSIS — I480 Paroxysmal atrial fibrillation: Secondary | ICD-10-CM | POA: Diagnosis not present

## 2016-03-03 DIAGNOSIS — F321 Major depressive disorder, single episode, moderate: Secondary | ICD-10-CM | POA: Diagnosis not present

## 2016-03-03 DIAGNOSIS — Z6825 Body mass index (BMI) 25.0-25.9, adult: Secondary | ICD-10-CM | POA: Diagnosis not present

## 2016-03-03 DIAGNOSIS — N184 Chronic kidney disease, stage 4 (severe): Secondary | ICD-10-CM | POA: Diagnosis not present

## 2016-03-27 DIAGNOSIS — Z961 Presence of intraocular lens: Secondary | ICD-10-CM | POA: Diagnosis not present

## 2016-03-27 DIAGNOSIS — H52201 Unspecified astigmatism, right eye: Secondary | ICD-10-CM | POA: Diagnosis not present

## 2016-03-27 DIAGNOSIS — H5201 Hypermetropia, right eye: Secondary | ICD-10-CM | POA: Diagnosis not present

## 2016-03-27 DIAGNOSIS — H1820 Unspecified corneal edema: Secondary | ICD-10-CM | POA: Diagnosis not present

## 2016-03-27 DIAGNOSIS — E119 Type 2 diabetes mellitus without complications: Secondary | ICD-10-CM | POA: Diagnosis not present

## 2016-04-12 DIAGNOSIS — C44629 Squamous cell carcinoma of skin of left upper limb, including shoulder: Secondary | ICD-10-CM | POA: Diagnosis not present

## 2016-05-04 DIAGNOSIS — C44629 Squamous cell carcinoma of skin of left upper limb, including shoulder: Secondary | ICD-10-CM | POA: Diagnosis not present

## 2016-05-04 DIAGNOSIS — Z85828 Personal history of other malignant neoplasm of skin: Secondary | ICD-10-CM | POA: Diagnosis not present

## 2016-06-12 DIAGNOSIS — F321 Major depressive disorder, single episode, moderate: Secondary | ICD-10-CM | POA: Diagnosis not present

## 2016-06-12 DIAGNOSIS — E7800 Pure hypercholesterolemia, unspecified: Secondary | ICD-10-CM | POA: Diagnosis not present

## 2016-06-12 DIAGNOSIS — E1129 Type 2 diabetes mellitus with other diabetic kidney complication: Secondary | ICD-10-CM | POA: Diagnosis not present

## 2016-06-12 DIAGNOSIS — M810 Age-related osteoporosis without current pathological fracture: Secondary | ICD-10-CM | POA: Diagnosis not present

## 2016-06-12 DIAGNOSIS — E038 Other specified hypothyroidism: Secondary | ICD-10-CM | POA: Diagnosis not present

## 2016-06-12 DIAGNOSIS — R808 Other proteinuria: Secondary | ICD-10-CM | POA: Diagnosis not present

## 2016-06-12 DIAGNOSIS — I129 Hypertensive chronic kidney disease with stage 1 through stage 4 chronic kidney disease, or unspecified chronic kidney disease: Secondary | ICD-10-CM | POA: Diagnosis not present

## 2016-06-12 DIAGNOSIS — Z6825 Body mass index (BMI) 25.0-25.9, adult: Secondary | ICD-10-CM | POA: Diagnosis not present

## 2016-06-12 DIAGNOSIS — I480 Paroxysmal atrial fibrillation: Secondary | ICD-10-CM | POA: Diagnosis not present

## 2016-06-12 DIAGNOSIS — I10 Essential (primary) hypertension: Secondary | ICD-10-CM | POA: Diagnosis not present

## 2016-06-12 DIAGNOSIS — N184 Chronic kidney disease, stage 4 (severe): Secondary | ICD-10-CM | POA: Diagnosis not present

## 2016-09-12 DIAGNOSIS — I129 Hypertensive chronic kidney disease with stage 1 through stage 4 chronic kidney disease, or unspecified chronic kidney disease: Secondary | ICD-10-CM | POA: Diagnosis not present

## 2016-09-12 DIAGNOSIS — M810 Age-related osteoporosis without current pathological fracture: Secondary | ICD-10-CM | POA: Diagnosis not present

## 2016-09-12 DIAGNOSIS — Z6826 Body mass index (BMI) 26.0-26.9, adult: Secondary | ICD-10-CM | POA: Diagnosis not present

## 2016-09-12 DIAGNOSIS — E7800 Pure hypercholesterolemia, unspecified: Secondary | ICD-10-CM | POA: Diagnosis not present

## 2016-09-12 DIAGNOSIS — I480 Paroxysmal atrial fibrillation: Secondary | ICD-10-CM | POA: Diagnosis not present

## 2016-09-12 DIAGNOSIS — E1129 Type 2 diabetes mellitus with other diabetic kidney complication: Secondary | ICD-10-CM | POA: Diagnosis not present

## 2016-09-12 DIAGNOSIS — E038 Other specified hypothyroidism: Secondary | ICD-10-CM | POA: Diagnosis not present

## 2016-09-12 DIAGNOSIS — F321 Major depressive disorder, single episode, moderate: Secondary | ICD-10-CM | POA: Diagnosis not present

## 2016-09-12 DIAGNOSIS — N401 Enlarged prostate with lower urinary tract symptoms: Secondary | ICD-10-CM | POA: Diagnosis not present

## 2016-09-12 DIAGNOSIS — R808 Other proteinuria: Secondary | ICD-10-CM | POA: Diagnosis not present

## 2016-09-12 DIAGNOSIS — N184 Chronic kidney disease, stage 4 (severe): Secondary | ICD-10-CM | POA: Diagnosis not present

## 2016-09-12 DIAGNOSIS — I10 Essential (primary) hypertension: Secondary | ICD-10-CM | POA: Diagnosis not present

## 2016-12-01 DIAGNOSIS — I480 Paroxysmal atrial fibrillation: Secondary | ICD-10-CM | POA: Diagnosis not present

## 2016-12-01 DIAGNOSIS — M810 Age-related osteoporosis without current pathological fracture: Secondary | ICD-10-CM | POA: Diagnosis not present

## 2016-12-01 DIAGNOSIS — E7800 Pure hypercholesterolemia, unspecified: Secondary | ICD-10-CM | POA: Diagnosis not present

## 2016-12-01 DIAGNOSIS — R808 Other proteinuria: Secondary | ICD-10-CM | POA: Diagnosis not present

## 2016-12-01 DIAGNOSIS — N184 Chronic kidney disease, stage 4 (severe): Secondary | ICD-10-CM | POA: Diagnosis not present

## 2016-12-01 DIAGNOSIS — R972 Elevated prostate specific antigen [PSA]: Secondary | ICD-10-CM | POA: Diagnosis not present

## 2016-12-01 DIAGNOSIS — I129 Hypertensive chronic kidney disease with stage 1 through stage 4 chronic kidney disease, or unspecified chronic kidney disease: Secondary | ICD-10-CM | POA: Diagnosis not present

## 2016-12-01 DIAGNOSIS — E1129 Type 2 diabetes mellitus with other diabetic kidney complication: Secondary | ICD-10-CM | POA: Diagnosis not present

## 2016-12-01 DIAGNOSIS — N401 Enlarged prostate with lower urinary tract symptoms: Secondary | ICD-10-CM | POA: Diagnosis not present

## 2016-12-01 DIAGNOSIS — Z6825 Body mass index (BMI) 25.0-25.9, adult: Secondary | ICD-10-CM | POA: Diagnosis not present

## 2016-12-01 DIAGNOSIS — E038 Other specified hypothyroidism: Secondary | ICD-10-CM | POA: Diagnosis not present

## 2017-03-01 DIAGNOSIS — M810 Age-related osteoporosis without current pathological fracture: Secondary | ICD-10-CM | POA: Diagnosis not present

## 2017-03-01 DIAGNOSIS — Z6825 Body mass index (BMI) 25.0-25.9, adult: Secondary | ICD-10-CM | POA: Diagnosis not present

## 2017-03-01 DIAGNOSIS — Z23 Encounter for immunization: Secondary | ICD-10-CM | POA: Diagnosis not present

## 2017-03-01 DIAGNOSIS — I129 Hypertensive chronic kidney disease with stage 1 through stage 4 chronic kidney disease, or unspecified chronic kidney disease: Secondary | ICD-10-CM | POA: Diagnosis not present

## 2017-03-01 DIAGNOSIS — E7800 Pure hypercholesterolemia, unspecified: Secondary | ICD-10-CM | POA: Diagnosis not present

## 2017-03-01 DIAGNOSIS — E1129 Type 2 diabetes mellitus with other diabetic kidney complication: Secondary | ICD-10-CM | POA: Diagnosis not present

## 2017-03-01 DIAGNOSIS — I480 Paroxysmal atrial fibrillation: Secondary | ICD-10-CM | POA: Diagnosis not present

## 2017-03-01 DIAGNOSIS — F321 Major depressive disorder, single episode, moderate: Secondary | ICD-10-CM | POA: Diagnosis not present

## 2017-03-01 DIAGNOSIS — R808 Other proteinuria: Secondary | ICD-10-CM | POA: Diagnosis not present

## 2017-03-01 DIAGNOSIS — N184 Chronic kidney disease, stage 4 (severe): Secondary | ICD-10-CM | POA: Diagnosis not present

## 2017-03-01 DIAGNOSIS — E038 Other specified hypothyroidism: Secondary | ICD-10-CM | POA: Diagnosis not present

## 2017-03-01 DIAGNOSIS — R972 Elevated prostate specific antigen [PSA]: Secondary | ICD-10-CM | POA: Diagnosis not present

## 2017-06-08 DIAGNOSIS — I129 Hypertensive chronic kidney disease with stage 1 through stage 4 chronic kidney disease, or unspecified chronic kidney disease: Secondary | ICD-10-CM | POA: Diagnosis not present

## 2017-06-08 DIAGNOSIS — F324 Major depressive disorder, single episode, in partial remission: Secondary | ICD-10-CM | POA: Diagnosis not present

## 2017-06-08 DIAGNOSIS — N184 Chronic kidney disease, stage 4 (severe): Secondary | ICD-10-CM | POA: Diagnosis not present

## 2017-06-08 DIAGNOSIS — E1129 Type 2 diabetes mellitus with other diabetic kidney complication: Secondary | ICD-10-CM | POA: Diagnosis not present

## 2017-06-08 DIAGNOSIS — I480 Paroxysmal atrial fibrillation: Secondary | ICD-10-CM | POA: Diagnosis not present

## 2017-06-08 DIAGNOSIS — N401 Enlarged prostate with lower urinary tract symptoms: Secondary | ICD-10-CM | POA: Diagnosis not present

## 2017-06-08 DIAGNOSIS — Z6825 Body mass index (BMI) 25.0-25.9, adult: Secondary | ICD-10-CM | POA: Diagnosis not present

## 2017-06-08 DIAGNOSIS — E038 Other specified hypothyroidism: Secondary | ICD-10-CM | POA: Diagnosis not present

## 2017-06-08 DIAGNOSIS — R808 Other proteinuria: Secondary | ICD-10-CM | POA: Diagnosis not present

## 2017-06-08 DIAGNOSIS — E7800 Pure hypercholesterolemia, unspecified: Secondary | ICD-10-CM | POA: Diagnosis not present

## 2017-09-10 DIAGNOSIS — I10 Essential (primary) hypertension: Secondary | ICD-10-CM | POA: Diagnosis not present

## 2017-09-10 DIAGNOSIS — N401 Enlarged prostate with lower urinary tract symptoms: Secondary | ICD-10-CM | POA: Diagnosis not present

## 2017-09-10 DIAGNOSIS — I129 Hypertensive chronic kidney disease with stage 1 through stage 4 chronic kidney disease, or unspecified chronic kidney disease: Secondary | ICD-10-CM | POA: Diagnosis not present

## 2017-09-10 DIAGNOSIS — E7800 Pure hypercholesterolemia, unspecified: Secondary | ICD-10-CM | POA: Diagnosis not present

## 2017-09-10 DIAGNOSIS — E038 Other specified hypothyroidism: Secondary | ICD-10-CM | POA: Diagnosis not present

## 2017-09-10 DIAGNOSIS — I480 Paroxysmal atrial fibrillation: Secondary | ICD-10-CM | POA: Diagnosis not present

## 2017-09-10 DIAGNOSIS — E1129 Type 2 diabetes mellitus with other diabetic kidney complication: Secondary | ICD-10-CM | POA: Diagnosis not present

## 2017-09-10 DIAGNOSIS — F324 Major depressive disorder, single episode, in partial remission: Secondary | ICD-10-CM | POA: Diagnosis not present

## 2017-09-10 DIAGNOSIS — R808 Other proteinuria: Secondary | ICD-10-CM | POA: Diagnosis not present

## 2017-09-10 DIAGNOSIS — Z6825 Body mass index (BMI) 25.0-25.9, adult: Secondary | ICD-10-CM | POA: Diagnosis not present

## 2017-09-10 DIAGNOSIS — M810 Age-related osteoporosis without current pathological fracture: Secondary | ICD-10-CM | POA: Diagnosis not present

## 2017-12-13 DIAGNOSIS — I480 Paroxysmal atrial fibrillation: Secondary | ICD-10-CM | POA: Diagnosis not present

## 2017-12-13 DIAGNOSIS — I10 Essential (primary) hypertension: Secondary | ICD-10-CM | POA: Diagnosis not present

## 2017-12-13 DIAGNOSIS — M810 Age-related osteoporosis without current pathological fracture: Secondary | ICD-10-CM | POA: Diagnosis not present

## 2017-12-13 DIAGNOSIS — Z1389 Encounter for screening for other disorder: Secondary | ICD-10-CM | POA: Diagnosis not present

## 2017-12-13 DIAGNOSIS — N184 Chronic kidney disease, stage 4 (severe): Secondary | ICD-10-CM | POA: Diagnosis not present

## 2017-12-13 DIAGNOSIS — I129 Hypertensive chronic kidney disease with stage 1 through stage 4 chronic kidney disease, or unspecified chronic kidney disease: Secondary | ICD-10-CM | POA: Diagnosis not present

## 2017-12-13 DIAGNOSIS — E7800 Pure hypercholesterolemia, unspecified: Secondary | ICD-10-CM | POA: Diagnosis not present

## 2017-12-13 DIAGNOSIS — R808 Other proteinuria: Secondary | ICD-10-CM | POA: Diagnosis not present

## 2017-12-13 DIAGNOSIS — E038 Other specified hypothyroidism: Secondary | ICD-10-CM | POA: Diagnosis not present

## 2017-12-13 DIAGNOSIS — E1129 Type 2 diabetes mellitus with other diabetic kidney complication: Secondary | ICD-10-CM | POA: Diagnosis not present

## 2017-12-13 DIAGNOSIS — Z6825 Body mass index (BMI) 25.0-25.9, adult: Secondary | ICD-10-CM | POA: Diagnosis not present

## 2018-04-05 DIAGNOSIS — I129 Hypertensive chronic kidney disease with stage 1 through stage 4 chronic kidney disease, or unspecified chronic kidney disease: Secondary | ICD-10-CM | POA: Diagnosis not present

## 2018-04-05 DIAGNOSIS — Z23 Encounter for immunization: Secondary | ICD-10-CM | POA: Diagnosis not present

## 2018-04-05 DIAGNOSIS — N184 Chronic kidney disease, stage 4 (severe): Secondary | ICD-10-CM | POA: Diagnosis not present

## 2018-04-05 DIAGNOSIS — I10 Essential (primary) hypertension: Secondary | ICD-10-CM | POA: Diagnosis not present

## 2018-04-05 DIAGNOSIS — R808 Other proteinuria: Secondary | ICD-10-CM | POA: Diagnosis not present

## 2018-04-05 DIAGNOSIS — F324 Major depressive disorder, single episode, in partial remission: Secondary | ICD-10-CM | POA: Diagnosis not present

## 2018-04-05 DIAGNOSIS — E7800 Pure hypercholesterolemia, unspecified: Secondary | ICD-10-CM | POA: Diagnosis not present

## 2018-04-05 DIAGNOSIS — Z6824 Body mass index (BMI) 24.0-24.9, adult: Secondary | ICD-10-CM | POA: Diagnosis not present

## 2018-04-05 DIAGNOSIS — E038 Other specified hypothyroidism: Secondary | ICD-10-CM | POA: Diagnosis not present

## 2018-04-05 DIAGNOSIS — E1129 Type 2 diabetes mellitus with other diabetic kidney complication: Secondary | ICD-10-CM | POA: Diagnosis not present

## 2018-04-05 DIAGNOSIS — I480 Paroxysmal atrial fibrillation: Secondary | ICD-10-CM | POA: Diagnosis not present

## 2018-07-12 DIAGNOSIS — E1129 Type 2 diabetes mellitus with other diabetic kidney complication: Secondary | ICD-10-CM | POA: Diagnosis not present

## 2018-07-12 DIAGNOSIS — R808 Other proteinuria: Secondary | ICD-10-CM | POA: Diagnosis not present

## 2018-07-12 DIAGNOSIS — I129 Hypertensive chronic kidney disease with stage 1 through stage 4 chronic kidney disease, or unspecified chronic kidney disease: Secondary | ICD-10-CM | POA: Diagnosis not present

## 2018-07-12 DIAGNOSIS — E038 Other specified hypothyroidism: Secondary | ICD-10-CM | POA: Diagnosis not present

## 2018-07-12 DIAGNOSIS — E7800 Pure hypercholesterolemia, unspecified: Secondary | ICD-10-CM | POA: Diagnosis not present

## 2018-07-12 DIAGNOSIS — Z6824 Body mass index (BMI) 24.0-24.9, adult: Secondary | ICD-10-CM | POA: Diagnosis not present

## 2018-07-12 DIAGNOSIS — N184 Chronic kidney disease, stage 4 (severe): Secondary | ICD-10-CM | POA: Diagnosis not present

## 2018-07-12 DIAGNOSIS — I480 Paroxysmal atrial fibrillation: Secondary | ICD-10-CM | POA: Diagnosis not present

## 2018-07-12 DIAGNOSIS — I10 Essential (primary) hypertension: Secondary | ICD-10-CM | POA: Diagnosis not present

## 2018-07-18 DIAGNOSIS — H5203 Hypermetropia, bilateral: Secondary | ICD-10-CM | POA: Diagnosis not present

## 2018-07-18 DIAGNOSIS — H353131 Nonexudative age-related macular degeneration, bilateral, early dry stage: Secondary | ICD-10-CM | POA: Diagnosis not present

## 2018-07-18 DIAGNOSIS — H52203 Unspecified astigmatism, bilateral: Secondary | ICD-10-CM | POA: Diagnosis not present

## 2018-07-18 DIAGNOSIS — E119 Type 2 diabetes mellitus without complications: Secondary | ICD-10-CM | POA: Diagnosis not present

## 2018-09-18 DIAGNOSIS — H548 Legal blindness, as defined in USA: Secondary | ICD-10-CM | POA: Diagnosis not present

## 2018-09-18 DIAGNOSIS — E119 Type 2 diabetes mellitus without complications: Secondary | ICD-10-CM | POA: Diagnosis not present

## 2018-09-18 DIAGNOSIS — Z961 Presence of intraocular lens: Secondary | ICD-10-CM | POA: Diagnosis not present

## 2018-09-18 DIAGNOSIS — H353111 Nonexudative age-related macular degeneration, right eye, early dry stage: Secondary | ICD-10-CM | POA: Diagnosis not present

## 2018-09-18 DIAGNOSIS — H547 Unspecified visual loss: Secondary | ICD-10-CM | POA: Diagnosis not present

## 2018-09-18 DIAGNOSIS — H35371 Puckering of macula, right eye: Secondary | ICD-10-CM | POA: Diagnosis not present

## 2018-10-16 DIAGNOSIS — I480 Paroxysmal atrial fibrillation: Secondary | ICD-10-CM | POA: Diagnosis not present

## 2018-10-16 DIAGNOSIS — R809 Proteinuria, unspecified: Secondary | ICD-10-CM | POA: Diagnosis not present

## 2018-10-16 DIAGNOSIS — N184 Chronic kidney disease, stage 4 (severe): Secondary | ICD-10-CM | POA: Diagnosis not present

## 2018-10-16 DIAGNOSIS — E1129 Type 2 diabetes mellitus with other diabetic kidney complication: Secondary | ICD-10-CM | POA: Diagnosis not present

## 2018-10-16 DIAGNOSIS — E039 Hypothyroidism, unspecified: Secondary | ICD-10-CM | POA: Diagnosis not present

## 2018-10-16 DIAGNOSIS — E7800 Pure hypercholesterolemia, unspecified: Secondary | ICD-10-CM | POA: Diagnosis not present

## 2018-10-16 DIAGNOSIS — I129 Hypertensive chronic kidney disease with stage 1 through stage 4 chronic kidney disease, or unspecified chronic kidney disease: Secondary | ICD-10-CM | POA: Diagnosis not present

## 2018-10-16 DIAGNOSIS — L309 Dermatitis, unspecified: Secondary | ICD-10-CM | POA: Diagnosis not present

## 2018-10-16 DIAGNOSIS — M810 Age-related osteoporosis without current pathological fracture: Secondary | ICD-10-CM | POA: Diagnosis not present

## 2018-12-25 DIAGNOSIS — H353111 Nonexudative age-related macular degeneration, right eye, early dry stage: Secondary | ICD-10-CM | POA: Diagnosis not present

## 2018-12-25 DIAGNOSIS — Z961 Presence of intraocular lens: Secondary | ICD-10-CM | POA: Diagnosis not present

## 2019-01-15 DIAGNOSIS — N401 Enlarged prostate with lower urinary tract symptoms: Secondary | ICD-10-CM | POA: Diagnosis not present

## 2019-01-15 DIAGNOSIS — I480 Paroxysmal atrial fibrillation: Secondary | ICD-10-CM | POA: Diagnosis not present

## 2019-01-15 DIAGNOSIS — E1129 Type 2 diabetes mellitus with other diabetic kidney complication: Secondary | ICD-10-CM | POA: Diagnosis not present

## 2019-01-15 DIAGNOSIS — I129 Hypertensive chronic kidney disease with stage 1 through stage 4 chronic kidney disease, or unspecified chronic kidney disease: Secondary | ICD-10-CM | POA: Diagnosis not present

## 2019-01-15 DIAGNOSIS — E7800 Pure hypercholesterolemia, unspecified: Secondary | ICD-10-CM | POA: Diagnosis not present

## 2019-01-15 DIAGNOSIS — E039 Hypothyroidism, unspecified: Secondary | ICD-10-CM | POA: Diagnosis not present

## 2019-01-15 DIAGNOSIS — N184 Chronic kidney disease, stage 4 (severe): Secondary | ICD-10-CM | POA: Diagnosis not present

## 2019-01-15 DIAGNOSIS — R809 Proteinuria, unspecified: Secondary | ICD-10-CM | POA: Diagnosis not present

## 2019-01-16 DIAGNOSIS — E1129 Type 2 diabetes mellitus with other diabetic kidney complication: Secondary | ICD-10-CM | POA: Diagnosis not present

## 2019-01-16 DIAGNOSIS — N184 Chronic kidney disease, stage 4 (severe): Secondary | ICD-10-CM | POA: Diagnosis not present

## 2019-01-16 DIAGNOSIS — E038 Other specified hypothyroidism: Secondary | ICD-10-CM | POA: Diagnosis not present

## 2019-04-02 DIAGNOSIS — Z119 Encounter for screening for infectious and parasitic diseases, unspecified: Secondary | ICD-10-CM | POA: Diagnosis not present

## 2019-04-22 DIAGNOSIS — I4821 Permanent atrial fibrillation: Secondary | ICD-10-CM | POA: Diagnosis not present

## 2019-04-22 DIAGNOSIS — I129 Hypertensive chronic kidney disease with stage 1 through stage 4 chronic kidney disease, or unspecified chronic kidney disease: Secondary | ICD-10-CM | POA: Diagnosis not present

## 2019-04-22 DIAGNOSIS — E039 Hypothyroidism, unspecified: Secondary | ICD-10-CM | POA: Diagnosis not present

## 2019-04-22 DIAGNOSIS — E7800 Pure hypercholesterolemia, unspecified: Secondary | ICD-10-CM | POA: Diagnosis not present

## 2019-04-22 DIAGNOSIS — E1129 Type 2 diabetes mellitus with other diabetic kidney complication: Secondary | ICD-10-CM | POA: Diagnosis not present

## 2019-04-22 DIAGNOSIS — N184 Chronic kidney disease, stage 4 (severe): Secondary | ICD-10-CM | POA: Diagnosis not present

## 2019-04-22 DIAGNOSIS — R809 Proteinuria, unspecified: Secondary | ICD-10-CM | POA: Diagnosis not present

## 2019-04-23 DIAGNOSIS — I129 Hypertensive chronic kidney disease with stage 1 through stage 4 chronic kidney disease, or unspecified chronic kidney disease: Secondary | ICD-10-CM | POA: Diagnosis not present

## 2019-04-23 DIAGNOSIS — E1129 Type 2 diabetes mellitus with other diabetic kidney complication: Secondary | ICD-10-CM | POA: Diagnosis not present

## 2019-06-30 DIAGNOSIS — Z1159 Encounter for screening for other viral diseases: Secondary | ICD-10-CM | POA: Diagnosis not present

## 2019-06-30 DIAGNOSIS — Z20828 Contact with and (suspected) exposure to other viral communicable diseases: Secondary | ICD-10-CM | POA: Diagnosis not present

## 2019-07-07 DIAGNOSIS — Z20828 Contact with and (suspected) exposure to other viral communicable diseases: Secondary | ICD-10-CM | POA: Diagnosis not present

## 2019-07-07 DIAGNOSIS — Z1159 Encounter for screening for other viral diseases: Secondary | ICD-10-CM | POA: Diagnosis not present

## 2019-07-17 DIAGNOSIS — Z1159 Encounter for screening for other viral diseases: Secondary | ICD-10-CM | POA: Diagnosis not present

## 2019-07-17 DIAGNOSIS — Z20828 Contact with and (suspected) exposure to other viral communicable diseases: Secondary | ICD-10-CM | POA: Diagnosis not present

## 2019-07-21 DIAGNOSIS — Z20828 Contact with and (suspected) exposure to other viral communicable diseases: Secondary | ICD-10-CM | POA: Diagnosis not present

## 2019-07-21 DIAGNOSIS — Z1159 Encounter for screening for other viral diseases: Secondary | ICD-10-CM | POA: Diagnosis not present

## 2019-07-24 DIAGNOSIS — Z1159 Encounter for screening for other viral diseases: Secondary | ICD-10-CM | POA: Diagnosis not present

## 2019-07-24 DIAGNOSIS — Z20828 Contact with and (suspected) exposure to other viral communicable diseases: Secondary | ICD-10-CM | POA: Diagnosis not present

## 2019-07-28 DIAGNOSIS — Z1159 Encounter for screening for other viral diseases: Secondary | ICD-10-CM | POA: Diagnosis not present

## 2019-07-28 DIAGNOSIS — Z20828 Contact with and (suspected) exposure to other viral communicable diseases: Secondary | ICD-10-CM | POA: Diagnosis not present

## 2019-08-04 DIAGNOSIS — Z1159 Encounter for screening for other viral diseases: Secondary | ICD-10-CM | POA: Diagnosis not present

## 2019-08-04 DIAGNOSIS — Z20828 Contact with and (suspected) exposure to other viral communicable diseases: Secondary | ICD-10-CM | POA: Diagnosis not present

## 2019-08-11 DIAGNOSIS — Z20828 Contact with and (suspected) exposure to other viral communicable diseases: Secondary | ICD-10-CM | POA: Diagnosis not present

## 2019-08-11 DIAGNOSIS — Z1159 Encounter for screening for other viral diseases: Secondary | ICD-10-CM | POA: Diagnosis not present

## 2019-08-18 DIAGNOSIS — Z20828 Contact with and (suspected) exposure to other viral communicable diseases: Secondary | ICD-10-CM | POA: Diagnosis not present

## 2019-08-18 DIAGNOSIS — Z1159 Encounter for screening for other viral diseases: Secondary | ICD-10-CM | POA: Diagnosis not present

## 2019-08-21 ENCOUNTER — Emergency Department (HOSPITAL_COMMUNITY): Payer: Medicare Other

## 2019-08-21 ENCOUNTER — Inpatient Hospital Stay (HOSPITAL_COMMUNITY): Payer: Medicare Other

## 2019-08-21 ENCOUNTER — Inpatient Hospital Stay (HOSPITAL_COMMUNITY)
Admission: EM | Admit: 2019-08-21 | Discharge: 2019-09-01 | DRG: 177 | Disposition: A | Discharge status: Skilled Nursing Facility | Payer: Medicare Other | Attending: Internal Medicine | Admitting: Internal Medicine

## 2019-08-21 DIAGNOSIS — J1289 Other viral pneumonia: Secondary | ICD-10-CM | POA: Diagnosis not present

## 2019-08-21 DIAGNOSIS — R279 Unspecified lack of coordination: Secondary | ICD-10-CM | POA: Diagnosis not present

## 2019-08-21 DIAGNOSIS — I4510 Unspecified right bundle-branch block: Secondary | ICD-10-CM | POA: Diagnosis present

## 2019-08-21 DIAGNOSIS — I480 Paroxysmal atrial fibrillation: Secondary | ICD-10-CM | POA: Diagnosis present

## 2019-08-21 DIAGNOSIS — N1832 Chronic kidney disease, stage 3b: Secondary | ICD-10-CM | POA: Diagnosis not present

## 2019-08-21 DIAGNOSIS — I132 Hypertensive heart and chronic kidney disease with heart failure and with stage 5 chronic kidney disease, or end stage renal disease: Secondary | ICD-10-CM | POA: Diagnosis not present

## 2019-08-21 DIAGNOSIS — R778 Other specified abnormalities of plasma proteins: Secondary | ICD-10-CM | POA: Diagnosis not present

## 2019-08-21 DIAGNOSIS — Z823 Family history of stroke: Secondary | ICD-10-CM

## 2019-08-21 DIAGNOSIS — R945 Abnormal results of liver function studies: Secondary | ICD-10-CM | POA: Diagnosis not present

## 2019-08-21 DIAGNOSIS — E872 Acidosis: Secondary | ICD-10-CM | POA: Diagnosis not present

## 2019-08-21 DIAGNOSIS — Z8249 Family history of ischemic heart disease and other diseases of the circulatory system: Secondary | ICD-10-CM | POA: Diagnosis not present

## 2019-08-21 DIAGNOSIS — E039 Hypothyroidism, unspecified: Secondary | ICD-10-CM | POA: Diagnosis present

## 2019-08-21 DIAGNOSIS — R627 Adult failure to thrive: Secondary | ICD-10-CM | POA: Diagnosis present

## 2019-08-21 DIAGNOSIS — N186 End stage renal disease: Secondary | ICD-10-CM | POA: Diagnosis not present

## 2019-08-21 DIAGNOSIS — R6520 Severe sepsis without septic shock: Secondary | ICD-10-CM | POA: Diagnosis not present

## 2019-08-21 DIAGNOSIS — R1310 Dysphagia, unspecified: Secondary | ICD-10-CM | POA: Diagnosis present

## 2019-08-21 DIAGNOSIS — N179 Acute kidney failure, unspecified: Secondary | ICD-10-CM | POA: Diagnosis present

## 2019-08-21 DIAGNOSIS — Z809 Family history of malignant neoplasm, unspecified: Secondary | ICD-10-CM

## 2019-08-21 DIAGNOSIS — R32 Unspecified urinary incontinence: Secondary | ICD-10-CM | POA: Diagnosis not present

## 2019-08-21 DIAGNOSIS — I5043 Acute on chronic combined systolic (congestive) and diastolic (congestive) heart failure: Secondary | ICD-10-CM | POA: Diagnosis not present

## 2019-08-21 DIAGNOSIS — R6251 Failure to thrive (child): Secondary | ICD-10-CM

## 2019-08-21 DIAGNOSIS — J9601 Acute respiratory failure with hypoxia: Secondary | ICD-10-CM | POA: Diagnosis not present

## 2019-08-21 DIAGNOSIS — E1122 Type 2 diabetes mellitus with diabetic chronic kidney disease: Secondary | ICD-10-CM

## 2019-08-21 DIAGNOSIS — I10 Essential (primary) hypertension: Secondary | ICD-10-CM | POA: Diagnosis present

## 2019-08-21 DIAGNOSIS — E785 Hyperlipidemia, unspecified: Secondary | ICD-10-CM | POA: Diagnosis present

## 2019-08-21 DIAGNOSIS — I5022 Chronic systolic (congestive) heart failure: Secondary | ICD-10-CM | POA: Diagnosis present

## 2019-08-21 DIAGNOSIS — R509 Fever, unspecified: Secondary | ICD-10-CM | POA: Diagnosis not present

## 2019-08-21 DIAGNOSIS — Z7984 Long term (current) use of oral hypoglycemic drugs: Secondary | ICD-10-CM

## 2019-08-21 DIAGNOSIS — N400 Enlarged prostate without lower urinary tract symptoms: Secondary | ICD-10-CM | POA: Diagnosis present

## 2019-08-21 DIAGNOSIS — R918 Other nonspecific abnormal finding of lung field: Secondary | ICD-10-CM | POA: Diagnosis not present

## 2019-08-21 DIAGNOSIS — I4891 Unspecified atrial fibrillation: Secondary | ICD-10-CM | POA: Diagnosis not present

## 2019-08-21 DIAGNOSIS — Z66 Do not resuscitate: Secondary | ICD-10-CM | POA: Diagnosis present

## 2019-08-21 DIAGNOSIS — Z743 Need for continuous supervision: Secondary | ICD-10-CM | POA: Diagnosis not present

## 2019-08-21 DIAGNOSIS — E1121 Type 2 diabetes mellitus with diabetic nephropathy: Secondary | ICD-10-CM

## 2019-08-21 DIAGNOSIS — R531 Weakness: Secondary | ICD-10-CM | POA: Diagnosis not present

## 2019-08-21 DIAGNOSIS — E46 Unspecified protein-calorie malnutrition: Secondary | ICD-10-CM | POA: Diagnosis present

## 2019-08-21 DIAGNOSIS — R410 Disorientation, unspecified: Secondary | ICD-10-CM | POA: Diagnosis not present

## 2019-08-21 DIAGNOSIS — A419 Sepsis, unspecified organism: Secondary | ICD-10-CM | POA: Diagnosis present

## 2019-08-21 DIAGNOSIS — U071 COVID-19: Principal | ICD-10-CM | POA: Diagnosis present

## 2019-08-21 DIAGNOSIS — R0902 Hypoxemia: Secondary | ICD-10-CM | POA: Diagnosis not present

## 2019-08-21 DIAGNOSIS — J9600 Acute respiratory failure, unspecified whether with hypoxia or hypercapnia: Secondary | ICD-10-CM | POA: Diagnosis not present

## 2019-08-21 DIAGNOSIS — Z820 Family history of epilepsy and other diseases of the nervous system: Secondary | ICD-10-CM | POA: Diagnosis not present

## 2019-08-21 DIAGNOSIS — Y95 Nosocomial condition: Secondary | ICD-10-CM | POA: Diagnosis present

## 2019-08-21 DIAGNOSIS — E119 Type 2 diabetes mellitus without complications: Secondary | ICD-10-CM

## 2019-08-21 DIAGNOSIS — R000 Tachycardia, unspecified: Secondary | ICD-10-CM | POA: Diagnosis not present

## 2019-08-21 DIAGNOSIS — E876 Hypokalemia: Secondary | ICD-10-CM | POA: Diagnosis not present

## 2019-08-21 DIAGNOSIS — R0600 Dyspnea, unspecified: Secondary | ICD-10-CM | POA: Diagnosis not present

## 2019-08-21 DIAGNOSIS — N184 Chronic kidney disease, stage 4 (severe): Secondary | ICD-10-CM | POA: Diagnosis present

## 2019-08-21 DIAGNOSIS — F0390 Unspecified dementia without behavioral disturbance: Secondary | ICD-10-CM | POA: Diagnosis present

## 2019-08-21 DIAGNOSIS — T380X5A Adverse effect of glucocorticoids and synthetic analogues, initial encounter: Secondary | ICD-10-CM | POA: Diagnosis not present

## 2019-08-21 DIAGNOSIS — Z87891 Personal history of nicotine dependence: Secondary | ICD-10-CM

## 2019-08-21 DIAGNOSIS — I130 Hypertensive heart and chronic kidney disease with heart failure and stage 1 through stage 4 chronic kidney disease, or unspecified chronic kidney disease: Secondary | ICD-10-CM | POA: Diagnosis present

## 2019-08-21 DIAGNOSIS — E860 Dehydration: Secondary | ICD-10-CM | POA: Diagnosis present

## 2019-08-21 DIAGNOSIS — J159 Unspecified bacterial pneumonia: Secondary | ICD-10-CM | POA: Diagnosis present

## 2019-08-21 DIAGNOSIS — J189 Pneumonia, unspecified organism: Secondary | ICD-10-CM | POA: Diagnosis not present

## 2019-08-21 DIAGNOSIS — G9341 Metabolic encephalopathy: Secondary | ICD-10-CM | POA: Diagnosis not present

## 2019-08-21 DIAGNOSIS — I5021 Acute systolic (congestive) heart failure: Secondary | ICD-10-CM | POA: Diagnosis not present

## 2019-08-21 DIAGNOSIS — Z880 Allergy status to penicillin: Secondary | ICD-10-CM

## 2019-08-21 DIAGNOSIS — Z741 Need for assistance with personal care: Secondary | ICD-10-CM | POA: Diagnosis not present

## 2019-08-21 DIAGNOSIS — Z6820 Body mass index (BMI) 20.0-20.9, adult: Secondary | ICD-10-CM

## 2019-08-21 DIAGNOSIS — J1282 Pneumonia due to coronavirus disease 2019: Secondary | ICD-10-CM

## 2019-08-21 DIAGNOSIS — E1165 Type 2 diabetes mellitus with hyperglycemia: Secondary | ICD-10-CM | POA: Diagnosis not present

## 2019-08-21 DIAGNOSIS — Z7189 Other specified counseling: Secondary | ICD-10-CM | POA: Diagnosis not present

## 2019-08-21 DIAGNOSIS — E43 Unspecified severe protein-calorie malnutrition: Secondary | ICD-10-CM | POA: Diagnosis present

## 2019-08-21 DIAGNOSIS — Z515 Encounter for palliative care: Secondary | ICD-10-CM | POA: Diagnosis not present

## 2019-08-21 DIAGNOSIS — I248 Other forms of acute ischemic heart disease: Secondary | ICD-10-CM | POA: Diagnosis not present

## 2019-08-21 DIAGNOSIS — Z79890 Hormone replacement therapy (postmenopausal): Secondary | ICD-10-CM

## 2019-08-21 DIAGNOSIS — K219 Gastro-esophageal reflux disease without esophagitis: Secondary | ICD-10-CM | POA: Diagnosis present

## 2019-08-21 DIAGNOSIS — Z7982 Long term (current) use of aspirin: Secondary | ICD-10-CM

## 2019-08-21 LAB — TRIGLYCERIDES: Triglycerides: 290 mg/dL — ABNORMAL HIGH (ref <150)

## 2019-08-21 LAB — CBC WITH DIFFERENTIAL/PLATELET
Abs Immature Granulocytes: 0.07 10*3/uL (ref 0.00–0.07)
Basophils Absolute: 0 10*3/uL (ref 0.0–0.1)
Basophils Relative: 0 %
Eosinophils Absolute: 0 10*3/uL (ref 0.0–0.5)
Eosinophils Relative: 0 %
HCT: 43.6 % (ref 39.0–52.0)
Hemoglobin: 14.6 g/dL (ref 13.0–17.0)
Immature Granulocytes: 1 %
Lymphocytes Relative: 3 %
Lymphs Abs: 0.4 10*3/uL — ABNORMAL LOW (ref 0.7–4.0)
MCH: 30.5 pg (ref 26.0–34.0)
MCHC: 33.5 g/dL (ref 30.0–36.0)
MCV: 91.2 fL (ref 80.0–100.0)
Monocytes Absolute: 0.5 10*3/uL (ref 0.1–1.0)
Monocytes Relative: 5 %
Neutro Abs: 10.5 10*3/uL — ABNORMAL HIGH (ref 1.7–7.7)
Neutrophils Relative %: 91 %
Platelets: 175 10*3/uL (ref 150–400)
RBC: 4.78 MIL/uL (ref 4.22–5.81)
RDW: 12.9 % (ref 11.5–15.5)
WBC: 11.5 10*3/uL — ABNORMAL HIGH (ref 4.0–10.5)
nRBC: 0 % (ref 0.0–0.2)

## 2019-08-21 LAB — COMPREHENSIVE METABOLIC PANEL
ALT: 27 U/L (ref 0–44)
AST: 47 U/L — ABNORMAL HIGH (ref 15–41)
Albumin: 2.4 g/dL — ABNORMAL LOW (ref 3.5–5.0)
Alkaline Phosphatase: 30 U/L — ABNORMAL LOW (ref 38–126)
Anion gap: 15 (ref 5–15)
BUN: 93 mg/dL — ABNORMAL HIGH (ref 8–23)
CO2: 22 mmol/L (ref 22–32)
Calcium: 7.8 mg/dL — ABNORMAL LOW (ref 8.9–10.3)
Chloride: 96 mmol/L — ABNORMAL LOW (ref 98–111)
Creatinine, Ser: 4.13 mg/dL — ABNORMAL HIGH (ref 0.61–1.24)
GFR calc Af Amer: 13 mL/min — ABNORMAL LOW (ref >60)
GFR calc non Af Amer: 11 mL/min — ABNORMAL LOW (ref >60)
Glucose, Bld: 258 mg/dL — ABNORMAL HIGH (ref 70–99)
Potassium: 3.1 mmol/L — ABNORMAL LOW (ref 3.5–5.1)
Sodium: 133 mmol/L — ABNORMAL LOW (ref 135–145)
Total Bilirubin: 1.8 mg/dL — ABNORMAL HIGH (ref 0.3–1.2)
Total Protein: 5.7 g/dL — ABNORMAL LOW (ref 6.5–8.1)

## 2019-08-21 LAB — URINALYSIS, ROUTINE W REFLEX MICROSCOPIC
Bilirubin Urine: NEGATIVE
Glucose, UA: NEGATIVE mg/dL
Ketones, ur: NEGATIVE mg/dL
Nitrite: NEGATIVE
Protein, ur: 100 mg/dL — AB
Specific Gravity, Urine: 1.014 (ref 1.005–1.030)
pH: 5 (ref 5.0–8.0)

## 2019-08-21 LAB — LACTIC ACID, PLASMA: Lactic Acid, Venous: 1.9 mmol/L (ref 0.5–1.9)

## 2019-08-21 LAB — SODIUM, URINE, RANDOM: Sodium, Ur: <10 mmol/L

## 2019-08-21 LAB — PROTEIN / CREATININE RATIO, URINE
Creatinine, Urine: 139.02 mg/dL
Protein Creatinine Ratio: 0.87 mg/mg{Cre} — ABNORMAL HIGH (ref 0.00–0.15)
Total Protein, Urine: 121 mg/dL

## 2019-08-21 LAB — LACTATE DEHYDROGENASE: LDH: 280 U/L — ABNORMAL HIGH (ref 98–192)

## 2019-08-21 LAB — CBG MONITORING, ED: Glucose-Capillary: 222 mg/dL — ABNORMAL HIGH (ref 70–99)

## 2019-08-21 LAB — POC SARS CORONAVIRUS 2 AG -  ED: SARS Coronavirus 2 Ag: POSITIVE — AB

## 2019-08-21 LAB — D-DIMER, QUANTITATIVE: D-Dimer, Quant: 1.92 ug/mL-FEU — ABNORMAL HIGH (ref 0.00–0.50)

## 2019-08-21 LAB — FIBRINOGEN: Fibrinogen: 426 mg/dL (ref 210–475)

## 2019-08-21 LAB — CREATININE, SERUM
Creatinine, Ser: 4.22 mg/dL — ABNORMAL HIGH (ref 0.61–1.24)
GFR calc Af Amer: 13 mL/min — ABNORMAL LOW (ref >60)
GFR calc non Af Amer: 11 mL/min — ABNORMAL LOW (ref >60)

## 2019-08-21 LAB — FERRITIN: Ferritin: 3218 ng/mL — ABNORMAL HIGH (ref 24–336)

## 2019-08-21 LAB — PROCALCITONIN: Procalcitonin: 2.06 ng/mL

## 2019-08-21 LAB — C-REACTIVE PROTEIN: CRP: 24 mg/dL — ABNORMAL HIGH (ref <1.0)

## 2019-08-21 LAB — TROPONIN I (HIGH SENSITIVITY): Troponin I (High Sensitivity): 1506 ng/L (ref <18)

## 2019-08-21 MED ORDER — PRO-STAT SUGAR FREE PO LIQD
30.0000 mL | Freq: Two times a day (BID) | ORAL | Status: DC
  Administered 2019-08-22 – 2019-09-01 (×21): 30 mL via ORAL
  Filled 2019-08-21 (×21): qty 30

## 2019-08-21 MED ORDER — SODIUM CHLORIDE 0.9 % IV SOLN
200.0000 mg | Freq: Once | INTRAVENOUS | Status: AC
  Administered 2019-08-21: 200 mg via INTRAVENOUS
  Filled 2019-08-21: qty 40

## 2019-08-21 MED ORDER — SODIUM CHLORIDE 0.9 % IV SOLN: INTRAVENOUS | Status: AC

## 2019-08-21 MED ORDER — HEPARIN SODIUM (PORCINE) 5000 UNIT/ML IJ SOLN
5000.0000 [IU] | Freq: Three times a day (TID) | INTRAMUSCULAR | Status: DC
  Administered 2019-08-21 – 2019-08-28 (×21): 5000 [IU] via SUBCUTANEOUS
  Filled 2019-08-21 (×21): qty 1

## 2019-08-21 MED ORDER — DEXAMETHASONE SODIUM PHOSPHATE 10 MG/ML IJ SOLN
6.0000 mg | INTRAMUSCULAR | Status: DC
  Administered 2019-08-21 – 2019-08-25 (×5): 6 mg via INTRAVENOUS
  Filled 2019-08-21 (×5): qty 1

## 2019-08-21 MED ORDER — SODIUM CHLORIDE 0.9 % IV SOLN
100.0000 mg | Freq: Every day | INTRAVENOUS | Status: AC
  Administered 2019-08-22 – 2019-08-25 (×4): 100 mg via INTRAVENOUS
  Filled 2019-08-21 (×5): qty 20

## 2019-08-21 MED ORDER — POTASSIUM CHLORIDE 10 MEQ/100ML IV SOLN
10.0000 meq | INTRAVENOUS | Status: AC
  Administered 2019-08-21 (×2): 10 meq via INTRAVENOUS
  Filled 2019-08-21 (×2): qty 100

## 2019-08-21 MED ORDER — ACETAMINOPHEN 500 MG PO TABS
1000.0000 mg | ORAL_TABLET | Freq: Once | ORAL | Status: AC
  Administered 2019-08-21: 18:00:00 1000 mg via ORAL
  Filled 2019-08-21: qty 2

## 2019-08-21 MED ORDER — INSULIN ASPART 100 UNIT/ML ~~LOC~~ SOLN
0.0000 [IU] | Freq: Three times a day (TID) | SUBCUTANEOUS | Status: DC
  Administered 2019-08-22 (×3): 3 [IU] via SUBCUTANEOUS
  Administered 2019-08-23 (×2): 7 [IU] via SUBCUTANEOUS
  Administered 2019-08-23: 5 [IU] via SUBCUTANEOUS
  Administered 2019-08-24 (×2): 9 [IU] via SUBCUTANEOUS

## 2019-08-21 MED ORDER — INSULIN ASPART 100 UNIT/ML ~~LOC~~ SOLN
0.0000 [IU] | Freq: Every day | SUBCUTANEOUS | Status: DC
  Administered 2019-08-21: 2 [IU] via SUBCUTANEOUS
  Administered 2019-08-22 – 2019-08-23 (×2): 3 [IU] via SUBCUTANEOUS

## 2019-08-21 MED ORDER — SODIUM CHLORIDE 0.9 % IV SOLN
100.0000 mg | Freq: Two times a day (BID) | INTRAVENOUS | Status: DC
  Administered 2019-08-21 – 2019-08-22 (×2): 100 mg via INTRAVENOUS
  Filled 2019-08-21 (×4): qty 100

## 2019-08-21 NOTE — H&P (Signed)
TRH H&P    Patient Demographics:    Nicolas Snyder, is a 86 y.o. male  MRN: [id number]  DOB - July 28, 1920  Admit Date - 08/21/2019  Referring MD/NP/PA: Aundria Rud  Outpatient Primary MD for the patient is Tisovec, Fransico Him, MD  Patient coming from: home  Chief complaint- fever, covid, ARF   HPI:    Nicolas Snyder  is a 86 y.o. male,  w gerd, hypertension, hyperlipidemia, Dm2, CKD stage 3 (baseline creatinine 2), Pafib, Hypothyroidism, DNR, covid-19 positive, apparently presents with c/o poor appetite, There was concerns for dehydration at [facility] and therefore sent to ED for evaluation.   In ED,  T 101.8 P 103, R 27, Bp 142/99 pox 95% on RA  CXR IMPRESSION: Indistinct opacity throughout the right mid and lower lung suspicious for COVID-19 pneumonia in this setting. No pleural effusion.  Na 133, K 3.1, Bun 93, Creatinine 4.13 Ast 47 , Alt 27, Alk phos 30, T. Bili 1.8 Alb 2.4 Wbc 1.5, Hgb 14.6, Plt 175 D dimer 1.92 Procalcitonin 2.06 LDH 280 Tg 290 Ferritin 3,218 Crp 24 Lactic acid 1.9  Blood culture x2 pending Urinalysis pending  Pt started on ns at 16m per hour  ED requests admission for ARF, Covid -19       Review of systems:    In addition to the HPI above,  No Fever-chills, No Headache, No changes with Vision or hearing, No problems swallowing food or Liquids, No Chest pain, Cough or Shortness of Breath, No Abdominal pain, No Nausea or Vomiting, bowel movements are regular, No Blood in stool or Urine, No dysuria, No new skin rashes or bruises, No new joints pains-aches,  No new weakness, tingling, numbness in any extremity, No recent weight gain or loss, No polyuria, polydypsia or polyphagia, No significant Mental Stressors.  All other systems reviewed and are negative.    Past History of the following :    Past Medical History:  Diagnosis Date  . AF  (atrial fibrillation)   . DM (diabetes mellitus)   . GERD (gastroesophageal reflux disease)   . Hyperlipemia   . Hypertension   . Hypothyroidism   . Renal disorder    renal lithiasis      Past Surgical History:  Procedure Laterality Date  . FRACTURE SURGERY Left 2012  . KIDNEY STONE SURGERY        Social History:      Social History   Tobacco Use  . Smoking status: Former Smoker  Substance Use Topics  . Alcohol use: No       Family History :     Family History  Problem Relation Age of Onset  . Stroke Father        DECEASED  . Cancer Mother        DECEASED  . Heart attack Brother        DECEASED  . Coronary artery disease Brother [id number]      CABG  . Alzheimer's disease Sister        DECEASED  Home Medications:   Prior to Admission medications   Medication Sig Start Date End Date Taking? Authorizing Provider  aspirin 81 MG tablet Take 81 mg by mouth daily.    [provider]  calcium carbonate (TUMS - DOSED IN MG ELEMENTAL CALCIUM) 500 MG chewable tablet Chew 1 tablet by mouth daily.    [provider]  Cholecalciferol (VITAMIN D) 2000 UNITS tablet Take 2,000 Units by mouth daily.    [provider]  diltiazem (CARDIZEM CD) 360 MG 24 hr capsule Take 360 mg by mouth daily.    [provider]  docusate sodium (COLACE) 100 MG capsule Take 1 capsule (100 mg total) by mouth every 12 (twelve) hours. 11/22/13   Ward, Delice Bison, DO  esomeprazole (NEXIUM) 20 MG capsule Take 20 mg by mouth daily at 12 noon.    [provider]  fenofibrate (TRICOR) 48 MG tablet Take 48 mg by mouth daily.      [provider]  glipiZIDE (GLUCOTROL) 10 MG 24 hr tablet Take 10 mg by mouth daily.      [provider]  glipiZIDE (GLUCOTROL) 10 MG tablet Take 10 mg by mouth daily. 08/13/19   [provider]  levothyroxine (SYNTHROID, LEVOTHROID) 75 MCG tablet Take 75 mcg by mouth daily.      [provider]    lisinopril (PRINIVIL,ZESTRIL) 40 MG tablet Take 40 mg by mouth daily.      [provider]  losartan-hydrochlorothiazide (HYZAAR) 100-25 MG per tablet Take 1 tablet by mouth daily.      [provider]  Multiple Vitamins-Minerals (CENTRUM SILVER PO) Take by mouth daily.      [provider]  oxyCODONE-acetaminophen (PERCOCET/ROXICET) 5-325 MG per tablet Take 1-2 tablets by mouth every 6 (six) hours as needed. 11/22/13   Ward, Delice Bison, DO  pravastatin (PRAVACHOL) 10 MG tablet Take 10 mg by mouth daily.      [provider]  sodium-potassium bicarbonate (ALKA-SELTZER GOLD) TBEF dissolvable tablet Take 1 tablet by mouth daily as needed (for indigestion).    [provider]  Tamsulosin HCl (FLOMAX) 0.4 MG CAPS Take 0.4 mg by mouth daily after supper.     [provider]     Allergies:     Allergies  Allergen Reactions  . Penicillins Hives, Swelling and Other (See Comments)    Eye swelling      Physical Exam:   Vitals  Blood pressure (!) 129/91, pulse 96, temperature (!) 100.9 F (38.3 C), temperature source Rectal, resp. rate (!) 26, SpO2 93 %.  1.  General: axoxo3  2. Psychiatric: euthymic  3. Neurologic: Nonfocal, cn2-12 intact , reflexes 2+ symmetric, diffuse with no clonus, motor 5/5 in all 4 ext  4. HEENMT:  Anicteric, pupils 1.30m symmetric, direct, consensual intact Neck: no jvd  5. Respiratory : Crackles right lung base, left lung base, no wheezing  6. Cardiovascular : rrr s1, s2, no m/g/'r  7. Gastrointestinal:  Abd: soft, nt, nd, +bs  8. Skin:  Ext: no c/c/e, no rash  9.Musculoskeletal:  Good ROM     Data Review:    CBC Recent Labs  Lab 08/21/19 1651  WBC 11.5*  HGB 14.6  HCT 43.6  PLT 175  MCV 91.2  MCH 30.5  MCHC 33.5  RDW 12.9  LYMPHSABS 0.4*  MONOABS 0.5  EOSABS 0.0  BASOSABS 0.0    ------------------------------------------------------------------------------------------------------------------  Results for orders placed or performed during the hospital encounter of 08/21/19 (from  the past 48 hour(s))  Comprehensive metabolic panel     Status: Abnormal   Collection Time: 08/21/19  4:51 PM  Result Value Ref Range   Sodium 133 (L) 135 - 145 mmol/L   Potassium 3.1 (L) 3.5 - 5.1 mmol/L   Chloride 96 (L) 98 - 111 mmol/L   CO2 22 22 - 32 mmol/L   Glucose, Bld 258 (H) 70 - 99 mg/dL   BUN 93 (H) 8 - 23 mg/dL   Creatinine, Ser 4.13 (H) 0.61 - 1.24 mg/dL   Calcium 7.8 (L) 8.9 - 10.3 mg/dL   Total Protein 5.7 (L) 6.5 - 8.1 g/dL   Albumin 2.4 (L) 3.5 - 5.0 g/dL   AST 47 (H) 15 - 41 U/L   ALT 27 0 - 44 U/L   Alkaline Phosphatase 30 (L) 38 - 126 U/L   Total Bilirubin 1.8 (H) 0.3 - 1.2 mg/dL   GFR calc non Af Amer 11 (L) >60 mL/min   GFR calc Af Amer 13 (L) >60 mL/min   Anion gap 15 5 - 15    Comment: Performed at [facility], [address], [place], [place] [address]  CBC with Differential/Platelet     Status: Abnormal   Collection Time: 08/21/19  4:51 PM  Result Value Ref Range   WBC 11.5 (H) 4.0 - 10.5 K/uL   RBC 4.78 4.22 - 5.81 MIL/uL   Hemoglobin 14.6 13.0 - 17.0 g/dL   HCT 43.6 39.0 - 52.0 %   MCV 91.2 80.0 - 100.0 fL   MCH 30.5 26.0 - 34.0 pg   MCHC 33.5 30.0 - 36.0 g/dL   RDW 12.9 11.5 - 15.5 %   Platelets 175 150 - 400 K/uL   nRBC 0.0 0.0 - 0.2 %   Neutrophils Relative % 91 %   Neutro Abs 10.5 (H) 1.7 - 7.7 K/uL   Lymphocytes Relative 3 %   Lymphs Abs 0.4 (L) 0.7 - 4.0 K/uL   Monocytes Relative 5 %   Monocytes Absolute 0.5 0.1 - 1.0 K/uL   Eosinophils Relative 0 %   Eosinophils Absolute 0.0 0.0 - 0.5 K/uL   Basophils Relative 0 %   Basophils Absolute 0.0 0.0 - 0.1 K/uL   Immature Granulocytes 1 %   Abs Immature Granulocytes 0.07 0.00 - 0.07 K/uL    Comment: Performed at [facility] [address] [place], [place] [address]   D-dimer, quantitative     Status: Abnormal   Collection Time: 08/21/19  5:01 PM  Result Value Ref Range   D-Dimer, Quant 1.92 (H) 0.00 - 0.50 ug/mL-FEU    Comment: (NOTE) At the manufacturer cut-off of 0.50 ug/mL FEU, this assay has been documented to exclude PE with a sensitivity and negative predictive value of 97 to 99%.  At this time, this assay has not been approved by the FDA to exclude DVT/VTE. Results should be correlated with clinical presentation. Performed at [facility], [place] [address] [place], [place] [address]   Procalcitonin     Status: None   Collection Time: 08/21/19  5:01 PM  Result Value Ref Range   Procalcitonin 2.06 ng/mL    Comment:        Interpretation: PCT > 2 ng/mL: Systemic infection (sepsis) is likely, unless other causes are known. (NOTE)       Sepsis PCT Algorithm           Lower Respiratory Tract  Infection PCT Algorithm    ----------------------------     ----------------------------         PCT < 0.25 ng/mL                PCT < 0.10 ng/mL         Strongly encourage             Strongly discourage   discontinuation of antibiotics    initiation of antibiotics    ----------------------------     -----------------------------       PCT 0.25 - 0.50 ng/mL            PCT 0.10 - 0.25 ng/mL               OR       >80% decrease in PCT            Discourage initiation of                                            antibiotics      Encourage discontinuation           of antibiotics    ----------------------------     -----------------------------         PCT >= 0.50 ng/mL              PCT 0.26 - 0.50 ng/mL               AND       <80% decrease in PCT              Encourage initiation of                                             antibiotics       Encourage continuation           of antibiotics    ----------------------------     -----------------------------        PCT >= 0.50 ng/mL                  PCT >  0.50 ng/mL               AND         increase in PCT                  Strongly encourage                                      initiation of antibiotics    Strongly encourage escalation           of antibiotics                                     -----------------------------                                           PCT <= 0.25 ng/mL  OR                                        > 80% decrease in PCT                                     Discontinue / Do not initiate                                             antibiotics Performed at [facility], [place] [address], [place], [place] [address]   Lactate dehydrogenase     Status: Abnormal   Collection Time: 08/21/19  5:01 PM  Result Value Ref Range   LDH 280 (H) 98 - 192 U/L    Comment: Performed at [facility], [place] [address], [place], [place] [address]  Ferritin     Status: Abnormal   Collection Time: 08/21/19  5:01 PM  Result Value Ref Range   Ferritin 3,218 (H) 24 - 336 ng/mL    Comment: Performed at [facility], [place] [address], [place], [place] [address]  Fibrinogen     Status: None   Collection Time: 08/21/19  5:01 PM  Result Value Ref Range   Fibrinogen 426 210 - 475 mg/dL    Comment: Performed at [facility] [address] [place] [address], [place], [place] [address]  C-reactive protein     Status: Abnormal   Collection Time: 08/21/19  5:01 PM  Result Value Ref Range   CRP 24.0 (H) <1.0 mg/dL    Comment: Performed at [facility], [place] [address], [place], [place] [address]  Triglycerides     Status: Abnormal   Collection Time: 08/21/19  5:46 PM  Result Value Ref Range   Triglycerides 290 (H) <150 mg/dL    Comment: Performed at [facility] [address] [place], [place] [address]  Lactic acid, plasma     Status: None   Collection Time: 08/21/19  6:00 PM  Result Value Ref Range   Lactic Acid, Venous 1.9 0.5 - 1.9 mmol/L    Comment: Performed at [facility] [address] [place], [place] [address]  POC SARS Coronavirus 2 Ag-ED - Nasal Swab (BD Veritor Kit)     Status: Abnormal   Collection Time: 08/21/19  7:11 PM  Result Value Ref Range   SARS Coronavirus 2 Ag POSITIVE (A) NEGATIVE    Comment: (NOTE) SARS-CoV-2 antigen PRESENT. Positive results indicate the presence of viral antigens, but clinical correlation with patient history and other diagnostic information is necessary to determine patient infection status.  Positive results do not rule out bacterial infection or co-infection  with other viruses. False positive results are rare but can occur, and confirmatory RT-PCR testing may be appropriate in some circumstances. The expected result is Negative. Fact Sheet for Patients: PodPark.tn Fact Sheet for Providers: GiftContent.is  This test is not yet approved or cleared by the [place] FDA and  has been authorized for detection and/or diagnosis of SARS-CoV-2 by FDA under an Emergency Use Authorization (EUA).  This EUA will remain in effect (meaning this test can be used) for the duration of  the COVID-19 declaration under Section 564(b)(1) of the Act, 21 U.S.C. section 360bbb-3(b)(1), unless the a uthorization is terminated or revoked sooner.     Chemistries  Recent Labs  Lab 08/21/19 1651  NA 133*  K 3.1*  CL 96*  CO2 22  GLUCOSE 258*  BUN 93*  CREATININE 4.13*  CALCIUM 7.8*  AST 47*  ALT 27  ALKPHOS 30*  BILITOT 1.8*   ------------------------------------------------------------------------------------------------------------------  ------------------------------------------------------------------------------------------------------------------ GFR: CrCl cannot be calculated (Unknown ideal weight.). Liver Function Tests: Recent Labs  Lab 08/21/19 1651  AST 47*  ALT 27  ALKPHOS 30*  BILITOT 1.8*  PROT 5.7*  ALBUMIN 2.4*   No  results for input(s): LIPASE, AMYLASE in the last 168 hours. No results for input(s): AMMONIA in the last 168 hours. Coagulation Profile: No results for input(s): INR, PROTIME in the last 168 hours. Cardiac Enzymes: No results for input(s): CKTOTAL, CKMB, CKMBINDEX, TROPONINI in the last 168 hours. BNP (last 3 results) No results for input(s): PROBNP in the last 8760 hours. HbA1C: No results for input(s): HGBA1C in the last 72 hours. CBG: No results for input(s): GLUCAP in the last 168 hours. Lipid Profile: Recent Labs    08/21/19 1746  TRIG 290*   Thyroid Function Tests: No results for input(s): TSH, T4TOTAL, FREET4, T3FREE, THYROIDAB in the last 72 hours. Anemia Panel: Recent Labs    08/21/19 1701  FERRITIN 3,218*    --------------------------------------------------------------------------------------------------------------- Urine analysis:    Component Value Date/Time   COLORURINE YELLOW 10/07/2010 1715   APPEARANCEUR CLEAR 10/07/2010 1715   LABSPEC 1.021 10/07/2010 1715   PHURINE 5.5 10/07/2010 1715   GLUCOSEU NEGATIVE 10/07/2010 1715   HGBUR SMALL (A) 10/07/2010 1715   BILIRUBINUR NEGATIVE 10/07/2010 1715   KETONESUR NEGATIVE 10/07/2010 1715   PROTEINUR NEGATIVE 10/07/2010 1715   UROBILINOGEN 0.2 10/07/2010 1715   NITRITE NEGATIVE 10/07/2010 1715   LEUKOCYTESUR SMALL (A) 10/07/2010 1715      Imaging Results:    DG Chest Port 1 View  Result Date: 08/21/2019 CLINICAL DATA:  86 year old male with fever, positive COVID-19. EXAM: PORTABLE CHEST 1 VIEW COMPARISON:  Chest radiographs 10/05/2010. FINDINGS: Portable AP semi upright view at 1723 hours. Patchy and confluent opacity in the right mid and lower lung, most pronounced in the lung periphery. Elsewhere when allowing for portable technique the lungs appear clear. Stable lung volumes. No pleural effusion is evident. Normal cardiac size and mediastinal contours. Visualized tracheal air column is within normal  limits. Medial right upper lung calcified granuloma. Negative visible bowel gas pattern. No acute osseous abnormality identified. IMPRESSION: Indistinct opacity throughout the right mid and lower lung suspicious for COVID-19 pneumonia in this setting. No pleural effusion. Electronically Signed   By: Genevie Ann M.D.   On: 08/21/2019 17:37       Assessment & Plan:    Principal Problem:   COVID-19 virus infection Active Problems:   AF (atrial fibrillation) (HCC)   DM (diabetes mellitus) ([place])   Hypertension   Hyperlipemia   Hypothyroidism   ARF (acute renal failure) (HCC)   Hypokalemia   Abnormal liver function   Covid-19 infection Dexamethasone 49m iv qday Remdesivir consult Consider Rinvoq or Actemra (crp elevated) Consider plasma  + D dimer Unable to obtain CTA chest VQ scan   Pneumonia ddx covid-19 , cap Blood culture x2 Urine strep antigen Urine legionella antigen Doxycycline 109miv bid   ARF on CKD stage 3 STOP ARB/ Ace-I Check urine sodium , urine prot/ creatinine, urine eosinophils Check  abdominal ultrasound Check spep, immunofixation Hydrate with ns at 28m per hour x 10 hours Check cmp in am  Hypokalemia Replete Check cmp in am  Abnormal liver function Check acute hepatitis panel Check abdominal ultrasound  Severe protein calorie malnutrition prostat 30 mL po bid  Hypothyroidism Cont Levothyroxine  Pafib Cont aspirin Cont cardizem CD 3621mpo qday  Hypertension Cont Cardizem as above STOP ? Lisinopril or Losartan due to ARF Hydralazine 1069mv q6h prn sbp >160  Hyperlipidemia Cont pravastatin 55m56m qhs Cont Tricor 48mg6mqday  Dm2 Hold Glipizide fsbs ac and qhs, ISS  Bph Cont Flomax  Gerd Cont PPI  DVT Prophylaxis-   heparin - SCDs   AM Labs Ordered, also please review Full Orders  Family Communication: Admission, patients condition and plan of care including tests being ordered have been discussed with the patient who  indicate understanding and agree with the plan and Code Status.  Code Status:  DNR per patient, spoke with [facility] Based on patients clinical presentation and evaluation of above clinical data, I have made determination that patient meets Inpatient criteria at this time. Pt  Has covid, and pneumonia and will require iv dexamethasone, remdesivir and iv abx, and will require >2 nites stay, his initial vitals attest to the severity of his illness.   Time spent in minutes : 70 minutes   JamesJani Gravelon 08/21/2019 at 8:30 PM

## 2019-08-21 NOTE — ED Triage Notes (Signed)
Came in via EMS from [facility]; called for a "sick person". Reported patient was recently seen in the hospital two daysago  d/t fall; patient was tested + for Covid. [facility] staff reported patient has been weak, lethargic and not eating the past two days. EMS endorsed initial heart rthythm of afib w/ rate of 150s. EMS administered 500 ml bolus and patient's heart rate decreased to 80s and noted improvement in mentation.

## 2019-08-21 NOTE — ED Notes (Signed)
+  Tele/[facility] called to transport pt

## 2019-08-21 NOTE — ED Notes (Signed)
Pt had bowel movement. Pt cleaned and brief changed.

## 2019-08-21 NOTE — ED Provider Notes (Signed)
[facility] EMERGENCY DEPARTMENT Provider Note   CSN: [id number] Arrival date & time: 08/21/19  1640     History Chief Complaint  Patient presents with  . Weakness    Nicolas Snyder is a 86 y.o. male.  Patient brought in by EMS from [facility].  Patient reportedly had a positive Covid test 2 days ago.  Patient reported to have been weak lethargic not eating for the past 2 days.  EMS said initial heart rhythm was atrial fib with a rate of 150s EMS gave her 500 cc bolus and patient's heart rate decreased to 80s.  And they also noted some improvement in his mentation.  Patient febrile upon arrival temp 101.8.  Heart rate was 101 respirations 20 oxygen saturations were initially reported 97.  But patient more consistently on room air at 94%.  Blood pressure 142/99.  Review of patient's medications he is on Cardizem CD.  Does not seem to be on any blood thinners.  Also on diabetic medicines.  Past medical history significant for diabetes atrial fibrillation hypertension hyperlipidemia.  Patient has not been seen in our system as far as emergency department goes since 2015.  Patient is awake but does seem to have some confusion.        Past Medical History:  Diagnosis Date  . AF (atrial fibrillation)   . DM (diabetes mellitus)   . GERD (gastroesophageal reflux disease)   . Hyperlipemia   . Hypertension   . Hypothyroidism   . Renal disorder    renal lithiasis    Patient Active Problem List   Diagnosis Date Noted  . AF (atrial fibrillation) ([place])   . DM (diabetes mellitus) ([place])   . Hypertension   . Hyperlipemia   . Hypothyroidism     Past Surgical History:  Procedure Laterality Date  . FRACTURE SURGERY Left 2012  . KIDNEY STONE SURGERY         Family History  Problem Relation Age of Onset  . Stroke Father        DECEASED  . Cancer Mother        DECEASED  . Heart attack Brother        DECEASED  . Coronary artery disease Brother 65       CABG  .  Alzheimer's disease Sister        DECEASED    Social History   Tobacco Use  . Smoking status: Former Smoker  Substance Use Topics  . Alcohol use: No  . Drug use: No    Home Medications Prior to Admission medications   Medication Sig Start Date End Date Taking? Authorizing Provider  aspirin 81 MG tablet Take 81 mg by mouth daily.    [provider]  calcium carbonate (TUMS - DOSED IN MG ELEMENTAL CALCIUM) 500 MG chewable tablet Chew 1 tablet by mouth daily.    [provider]  Cholecalciferol (VITAMIN D) 2000 UNITS tablet Take 2,000 Units by mouth daily.    [provider]  diltiazem (CARDIZEM CD) 360 MG 24 hr capsule Take 360 mg by mouth daily.    [provider]  docusate sodium (COLACE) 100 MG capsule Take 1 capsule (100 mg total) by mouth every 12 (twelve) hours. 11/22/13   Ward, Delice Bison, DO  esomeprazole (NEXIUM) 20 MG capsule Take 20 mg by mouth daily at 12 noon.    [provider]  fenofibrate (TRICOR) 48 MG tablet Take 48 mg by mouth daily.  [provider]  glipiZIDE (GLUCOTROL) 10 MG 24 hr tablet Take 10 mg by mouth daily.      [provider]  glipiZIDE (GLUCOTROL) 10 MG tablet Take 10 mg by mouth daily. 08/13/19   [provider]  levothyroxine (SYNTHROID, LEVOTHROID) 75 MCG tablet Take 75 mcg by mouth daily.      [provider]  lisinopril (PRINIVIL,ZESTRIL) 40 MG tablet Take 40 mg by mouth daily.      [provider]  losartan-hydrochlorothiazide (HYZAAR) 100-25 MG per tablet Take 1 tablet by mouth daily.      [provider]  Multiple Vitamins-Minerals (CENTRUM SILVER PO) Take by mouth daily.      [provider]  oxyCODONE-acetaminophen (PERCOCET/ROXICET) 5-325 MG per tablet Take 1-2 tablets by mouth every 6 (six) hours as needed. 11/22/13   Ward, Delice Bison, DO  pravastatin (PRAVACHOL) 10 MG tablet Take 10 mg by mouth daily.      [provider]    sodium-potassium bicarbonate (ALKA-SELTZER GOLD) TBEF dissolvable tablet Take 1 tablet by mouth daily as needed (for indigestion).    [provider]  Tamsulosin HCl (FLOMAX) 0.4 MG CAPS Take 0.4 mg by mouth daily after supper.     [provider]    Allergies    Penicillins  Review of Systems   Review of Systems  Constitutional: Positive for fatigue. Negative for chills and fever.  HENT: Negative for rhinorrhea and sore throat.   Eyes: Negative for visual disturbance.  Respiratory: Negative for cough and shortness of breath.   Cardiovascular: Negative for chest pain and leg swelling.  Gastrointestinal: Negative for abdominal pain, diarrhea, nausea and vomiting.  Genitourinary: Negative for dysuria.  Musculoskeletal: Negative for back pain and neck pain.  Skin: Negative for rash.  Neurological: Negative for dizziness, light-headedness and headaches.  Hematological: Does not bruise/bleed easily.  Psychiatric/Behavioral: Positive for confusion.    Physical Exam Updated Vital Signs BP (!) 133/97   Pulse (!) 101   Temp (!) 100.9 F (38.3 C) (Rectal)   Resp (!) 24   SpO2 95%   Physical Exam Vitals and nursing note reviewed.  Constitutional:      Appearance: Normal appearance. He is well-developed.  HENT:     Head: Normocephalic and atraumatic.     Mouth/Throat:     Mouth: Mucous membranes are dry.  Eyes:     Conjunctiva/sclera: Conjunctivae normal.     Pupils: Pupils are equal, round, and reactive to light.  Cardiovascular:     Rate and Rhythm: Normal rate and regular rhythm.     Heart sounds: No murmur.  Pulmonary:     Effort: Pulmonary effort is normal. No respiratory distress.     Breath sounds: Normal breath sounds.  Abdominal:     Palpations: Abdomen is soft.     Tenderness: There is no abdominal tenderness.  Musculoskeletal:        General: No swelling. Normal range of motion.     Cervical back: Normal range of motion and neck supple.   Skin:    General: Skin is warm and dry.  Neurological:     General: No focal deficit present.     Mental Status: He is alert. Mental status is at baseline.     ED Results / Procedures / Treatments   Labs (all labs ordered are listed, but only abnormal results are displayed) Labs Reviewed  COMPREHENSIVE METABOLIC PANEL - Abnormal; Notable for the following components:  Result Value   Sodium 133 (*)    Potassium 3.1 (*)    Chloride 96 (*)    Glucose, Bld 258 (*)    BUN 93 (*)    Creatinine, Ser 4.13 (*)    Calcium 7.8 (*)    Total Protein 5.7 (*)    Albumin 2.4 (*)    AST 47 (*)    Alkaline Phosphatase 30 (*)    Total Bilirubin 1.8 (*)    GFR calc non Af Amer 11 (*)    GFR calc Af Amer 13 (*)    All other components within normal limits  CBC WITH DIFFERENTIAL/PLATELET - Abnormal; Notable for the following components:   WBC 11.5 (*)    Neutro Abs 10.5 (*)    Lymphs Abs 0.4 (*)    All other components within normal limits  D-DIMER, QUANTITATIVE (NOT AT [facility]) - Abnormal; Notable for the following components:   D-Dimer, Quant 1.92 (*)    All other components within normal limits  LACTATE DEHYDROGENASE - Abnormal; Notable for the following components:   LDH 280 (*)    All other components within normal limits  TRIGLYCERIDES - Abnormal; Notable for the following components:   Triglycerides 290 (*)    All other components within normal limits  CULTURE, BLOOD (ROUTINE X 2)  CULTURE, BLOOD (ROUTINE X 2)  FIBRINOGEN  LACTIC ACID, PLASMA  LACTIC ACID, PLASMA  PROCALCITONIN  FERRITIN  C-REACTIVE PROTEIN  URINALYSIS, ROUTINE W REFLEX MICROSCOPIC  POC SARS CORONAVIRUS 2 AG -  ED    EKG EKG Interpretation  Date/Time:  Thursday August 21 2019 16:43:09 EST Ventricular Rate:  110 PR Interval:    QRS Duration: 154 QT Interval:  330 QTC Calculation: 447 R Axis:   -101 Text Interpretation: Atrial fibrillation Right bundle branch block Probable inferior infarct,  recent Abnormal lateral Q waves Confirmed by Fredia Sorrow [phone]) on 08/21/2019 4:49:38 PM   Radiology DG Chest Port 1 View  Result Date: 08/21/2019 CLINICAL DATA:  86 year old male with fever, positive COVID-19. EXAM: PORTABLE CHEST 1 VIEW COMPARISON:  Chest radiographs 10/05/2010. FINDINGS: Portable AP semi upright view at 1723 hours. Patchy and confluent opacity in the right mid and lower lung, most pronounced in the lung periphery. Elsewhere when allowing for portable technique the lungs appear clear. Stable lung volumes. No pleural effusion is evident. Normal cardiac size and mediastinal contours. Visualized tracheal air column is within normal limits. Medial right upper lung calcified granuloma. Negative visible bowel gas pattern. No acute osseous abnormality identified. IMPRESSION: Indistinct opacity throughout the right mid and lower lung suspicious for COVID-19 pneumonia in this setting. No pleural effusion. Electronically Signed   By: Genevie Ann M.D.   On: 08/21/2019 17:37    Procedures Procedures (including critical care time)  Medications Ordered in ED Medications  0.9 %  sodium chloride infusion ( Intravenous New Bag/Given 08/21/19 1840)  acetaminophen (TYLENOL) tablet 1,000 mg (1,000 mg Oral Given 08/21/19 1823)    ED Course  I have reviewed the triage vital signs and the nursing notes.  Pertinent labs & imaging results that were available during my care of the patient were reviewed by me and considered in my medical decision making (see chart for details).    MDM Rules/Calculators/A&P                     Patient with some slight tachycardia some tight slight tachypnea.  But oxygen saturations on room air remained around 94% at  rest.  Labs significant for marked increase in BUN and creatinine.  Consistent with an acute Inc. injury injury.  Also x-ray shows evidence of pneumonia right middle and right lower lobe area.  No Covid testing in the system so has been retested.  Covid  markers were ordered as well.  Patient received 500 cc fluid bolus by EMS.  I started 75 cc an hour.  He does appear to be clinically dry.  But do not want to over hydrate.  Will require admission just for the acute kidney injury.  Will contact hospitalist.    Final Clinical Impression(s) / ED Diagnoses Final diagnoses:  COVID-19 virus infection  AKI (acute kidney injury) ([place])  Failure to thrive (0-17)  Pneumonia due to COVID-19 virus    Rx / DC Orders ED Discharge Orders    None       Fredia Sorrow, MD 08/21/19 1901

## 2019-08-21 NOTE — ED Notes (Signed)
Date and time results received: 08/21/19 2310  Test: Troponin Critical Value: 1506  Name of Provider Notified: Dr. Jani Gravel  Orders Received? Or Actions Taken?:

## 2019-08-22 ENCOUNTER — Other Ambulatory Visit: Payer: Self-pay

## 2019-08-22 ENCOUNTER — Inpatient Hospital Stay (HOSPITAL_COMMUNITY): Payer: Medicare Other

## 2019-08-22 ENCOUNTER — Encounter (HOSPITAL_COMMUNITY): Payer: Self-pay | Admitting: Internal Medicine

## 2019-08-22 DIAGNOSIS — I248 Other forms of acute ischemic heart disease: Secondary | ICD-10-CM | POA: Insufficient documentation

## 2019-08-22 DIAGNOSIS — I4891 Unspecified atrial fibrillation: Secondary | ICD-10-CM

## 2019-08-22 DIAGNOSIS — R6520 Severe sepsis without septic shock: Secondary | ICD-10-CM | POA: Diagnosis present

## 2019-08-22 DIAGNOSIS — N183 Chronic kidney disease, stage 3 unspecified: Secondary | ICD-10-CM | POA: Insufficient documentation

## 2019-08-22 DIAGNOSIS — A419 Sepsis, unspecified organism: Secondary | ICD-10-CM | POA: Diagnosis present

## 2019-08-22 DIAGNOSIS — R778 Other specified abnormalities of plasma proteins: Secondary | ICD-10-CM

## 2019-08-22 DIAGNOSIS — I5022 Chronic systolic (congestive) heart failure: Secondary | ICD-10-CM | POA: Diagnosis present

## 2019-08-22 DIAGNOSIS — J189 Pneumonia, unspecified organism: Secondary | ICD-10-CM | POA: Insufficient documentation

## 2019-08-22 LAB — CBC WITH DIFFERENTIAL/PLATELET
Abs Immature Granulocytes: 0.08 10*3/uL — ABNORMAL HIGH (ref 0.00–0.07)
Basophils Absolute: 0 10*3/uL (ref 0.0–0.1)
Basophils Relative: 0 %
Eosinophils Absolute: 0 10*3/uL (ref 0.0–0.5)
Eosinophils Relative: 0 %
HCT: 42.8 % (ref 39.0–52.0)
Hemoglobin: 14.6 g/dL (ref 13.0–17.0)
Immature Granulocytes: 1 %
Lymphocytes Relative: 2 %
Lymphs Abs: 0.3 10*3/uL — ABNORMAL LOW (ref 0.7–4.0)
MCH: 31.1 pg (ref 26.0–34.0)
MCHC: 34.1 g/dL (ref 30.0–36.0)
MCV: 91.3 fL (ref 80.0–100.0)
Monocytes Absolute: 0.4 10*3/uL (ref 0.1–1.0)
Monocytes Relative: 3 %
Neutro Abs: 13.9 10*3/uL — ABNORMAL HIGH (ref 1.7–7.7)
Neutrophils Relative %: 94 %
Platelets: 182 10*3/uL (ref 150–400)
RBC: 4.69 MIL/uL (ref 4.22–5.81)
RDW: 13 % (ref 11.5–15.5)
WBC: 14.6 10*3/uL — ABNORMAL HIGH (ref 4.0–10.5)
nRBC: 0 % (ref 0.0–0.2)

## 2019-08-22 LAB — BASIC METABOLIC PANEL
Anion gap: 18 — ABNORMAL HIGH (ref 5–15)
BUN: 99 mg/dL — ABNORMAL HIGH (ref 8–23)
CO2: 20 mmol/L — ABNORMAL LOW (ref 22–32)
Calcium: 7.8 mg/dL — ABNORMAL LOW (ref 8.9–10.3)
Chloride: 97 mmol/L — ABNORMAL LOW (ref 98–111)
Creatinine, Ser: 4.2 mg/dL — ABNORMAL HIGH (ref 0.61–1.24)
GFR calc Af Amer: 13 mL/min — ABNORMAL LOW (ref >60)
GFR calc non Af Amer: 11 mL/min — ABNORMAL LOW (ref >60)
Glucose, Bld: 218 mg/dL — ABNORMAL HIGH (ref 70–99)
Potassium: 3.8 mmol/L (ref 3.5–5.1)
Sodium: 135 mmol/L (ref 135–145)

## 2019-08-22 LAB — C-REACTIVE PROTEIN: CRP: 25.2 mg/dL — ABNORMAL HIGH (ref <1.0)

## 2019-08-22 LAB — GLUCOSE, CAPILLARY
Glucose-Capillary: 233 mg/dL — ABNORMAL HIGH (ref 70–99)
Glucose-Capillary: 228 mg/dL — ABNORMAL HIGH (ref 70–99)
Glucose-Capillary: 251 mg/dL — ABNORMAL HIGH (ref 70–99)
Glucose-Capillary: 203 mg/dL — ABNORMAL HIGH (ref 70–99)

## 2019-08-22 LAB — FERRITIN: Ferritin: 3844 ng/mL — ABNORMAL HIGH (ref 24–336)

## 2019-08-22 LAB — CBC
HCT: 47.3 % (ref 39.0–52.0)
Hemoglobin: 15.7 g/dL (ref 13.0–17.0)
MCH: 30.5 pg (ref 26.0–34.0)
MCHC: 33.2 g/dL (ref 30.0–36.0)
MCV: 92 fL (ref 80.0–100.0)
Platelets: 200 10*3/uL (ref 150–400)
RBC: 5.14 MIL/uL (ref 4.22–5.81)
RDW: 13.2 % (ref 11.5–15.5)
WBC: 16.5 10*3/uL — ABNORMAL HIGH (ref 4.0–10.5)
nRBC: 0 % (ref 0.0–0.2)

## 2019-08-22 LAB — ECHOCARDIOGRAM COMPLETE
Height: 70 in
Weight: 2306.89 oz

## 2019-08-22 LAB — PROCALCITONIN: Procalcitonin: 2.25 ng/mL

## 2019-08-22 LAB — D-DIMER, QUANTITATIVE: D-Dimer, Quant: 1.97 ug{FEU}/mL — ABNORMAL HIGH (ref 0.00–0.50)

## 2019-08-22 LAB — TROPONIN I (HIGH SENSITIVITY)
Troponin I (High Sensitivity): 1276 ng/L (ref <18)
Troponin I (High Sensitivity): 743 ng/L (ref <18)
Troponin I (High Sensitivity): 686 ng/L (ref <18)

## 2019-08-22 LAB — COMPREHENSIVE METABOLIC PANEL
ALT: 25 U/L (ref 0–44)
AST: 49 U/L — ABNORMAL HIGH (ref 15–41)
Albumin: 2.4 g/dL — ABNORMAL LOW (ref 3.5–5.0)
Alkaline Phosphatase: 33 U/L — ABNORMAL LOW (ref 38–126)
Anion gap: 16 — ABNORMAL HIGH (ref 5–15)
BUN: 99 mg/dL — ABNORMAL HIGH (ref 8–23)
CO2: 19 mmol/L — ABNORMAL LOW (ref 22–32)
Calcium: 7.7 mg/dL — ABNORMAL LOW (ref 8.9–10.3)
Chloride: 97 mmol/L — ABNORMAL LOW (ref 98–111)
Creatinine, Ser: 4 mg/dL — ABNORMAL HIGH (ref 0.61–1.24)
GFR calc Af Amer: 14 mL/min — ABNORMAL LOW (ref >60)
GFR calc non Af Amer: 12 mL/min — ABNORMAL LOW (ref >60)
Glucose, Bld: 197 mg/dL — ABNORMAL HIGH (ref 70–99)
Potassium: 3.5 mmol/L (ref 3.5–5.1)
Sodium: 132 mmol/L — ABNORMAL LOW (ref 135–145)
Total Bilirubin: 1.3 mg/dL — ABNORMAL HIGH (ref 0.3–1.2)
Total Protein: 5.6 g/dL — ABNORMAL LOW (ref 6.5–8.1)

## 2019-08-22 LAB — SAMPLE TO BLOOD BANK

## 2019-08-22 LAB — LACTIC ACID, PLASMA
Lactic Acid, Venous: 2.2 mmol/L (ref 0.5–1.9)
Lactic Acid, Venous: 2.4 mmol/L (ref 0.5–1.9)

## 2019-08-22 LAB — BRAIN NATRIURETIC PEPTIDE: B Natriuretic Peptide: 554.1 pg/mL — ABNORMAL HIGH (ref 0.0–100.0)

## 2019-08-22 LAB — STREP PNEUMONIAE URINARY ANTIGEN: Strep Pneumo Urinary Antigen: NEGATIVE

## 2019-08-22 MED ORDER — ENSURE ENLIVE PO LIQD
237.0000 mL | Freq: Three times a day (TID) | ORAL | Status: DC
  Administered 2019-08-22 – 2019-08-31 (×26): 237 mL via ORAL

## 2019-08-22 MED ORDER — SODIUM CHLORIDE 0.9 % IV SOLN
500.0000 mg | INTRAVENOUS | Status: DC
  Administered 2019-08-22 – 2019-08-24 (×3): 500 mg via INTRAVENOUS
  Filled 2019-08-22 (×3): qty 500

## 2019-08-22 MED ORDER — LEVOTHYROXINE SODIUM 75 MCG PO TABS
75.0000 ug | ORAL_TABLET | Freq: Every day | ORAL | Status: DC
  Administered 2019-08-22 – 2019-09-01 (×11): 75 ug via ORAL
  Filled 2019-08-22 (×11): qty 1

## 2019-08-22 MED ORDER — SODIUM CHLORIDE 0.9 % IV SOLN: INTRAVENOUS | Status: DC

## 2019-08-22 MED ORDER — PRAVASTATIN SODIUM 10 MG PO TABS
10.0000 mg | ORAL_TABLET | Freq: Every day | ORAL | Status: DC
  Administered 2019-08-22 – 2019-08-30 (×9): 10 mg via ORAL
  Filled 2019-08-22 (×9): qty 1

## 2019-08-22 MED ORDER — PANTOPRAZOLE SODIUM 40 MG PO TBEC
40.0000 mg | DELAYED_RELEASE_TABLET | Freq: Every day | ORAL | Status: DC
  Administered 2019-08-22 – 2019-09-01 (×11): 40 mg via ORAL
  Filled 2019-08-22 (×11): qty 1

## 2019-08-22 MED ORDER — DOCUSATE SODIUM 100 MG PO CAPS
100.0000 mg | ORAL_CAPSULE | Freq: Two times a day (BID) | ORAL | Status: DC
  Administered 2019-08-22 – 2019-09-01 (×17): 100 mg via ORAL
  Filled 2019-08-22 (×19): qty 1

## 2019-08-22 MED ORDER — SODIUM CHLORIDE 0.9 % IV SOLN
1.0000 g | INTRAVENOUS | Status: AC
  Administered 2019-08-22 – 2019-08-26 (×5): 1 g via INTRAVENOUS
  Filled 2019-08-22 (×5): qty 1

## 2019-08-22 MED ORDER — TAMSULOSIN HCL 0.4 MG PO CAPS
0.4000 mg | ORAL_CAPSULE | Freq: Every day | ORAL | Status: DC
  Administered 2019-08-22 – 2019-08-30 (×9): 0.4 mg via ORAL
  Filled 2019-08-22 (×10): qty 1

## 2019-08-22 MED ORDER — ASPIRIN EC 81 MG PO TBEC
81.0000 mg | DELAYED_RELEASE_TABLET | Freq: Every day | ORAL | Status: DC
  Administered 2019-08-22 – 2019-08-31 (×10): 81 mg via ORAL
  Filled 2019-08-22 (×10): qty 1

## 2019-08-22 MED ORDER — FENOFIBRATE 54 MG PO TABS
54.0000 mg | ORAL_TABLET | Freq: Every day | ORAL | Status: DC
  Administered 2019-08-22 – 2019-08-31 (×10): 54 mg via ORAL
  Filled 2019-08-22 (×10): qty 1

## 2019-08-22 MED ORDER — DILTIAZEM HCL ER COATED BEADS 180 MG PO CP24
360.0000 mg | ORAL_CAPSULE | Freq: Every day | ORAL | Status: DC
  Administered 2019-08-22 – 2019-09-01 (×11): 360 mg via ORAL
  Filled 2019-08-22 (×12): qty 2

## 2019-08-22 NOTE — ED Notes (Signed)
Patient repositioned in bed.  Patient states he is comfortable and denies any needs at this time.

## 2019-08-22 NOTE — Evaluation (Signed)
Clinical/Bedside Swallow Evaluation Patient Details  Name: Nicolas Snyder MRN: [id number] Date of Birth: 04-20-1921  Today's Date: 08/22/2019 Time: SLP Start Time (ACUTE ONLY): 8099 SLP Stop Time (ACUTE ONLY): 1527 SLP Time Calculation (min) (ACUTE ONLY): 16 min  Past Medical History:  Past Medical History:  Diagnosis Date  . AF (atrial fibrillation) ([place])   . DM (diabetes mellitus) ([place])   . GERD (gastroesophageal reflux disease)   . Hyperlipemia   . Hypertension   . Hypothyroidism   . Renal disorder    renal lithiasis   Past Surgical History:  Past Surgical History:  Procedure Laterality Date  . FRACTURE SURGERY Left 2012  . KIDNEY STONE SURGERY     HPI:  Nicolas Snyder  is a 86 y.o. male,  w gerd, hypertension, GERD, hyperlipidemia, Dm2, CKD stage 3, Pafib, Hypothyroidism, DNR, covid-19 positive, presents with c/o poor appetite and concern for dehydration at [facility]. Positive Covid test 2/10. CXR Indistinct opacity throughout the right mid and lower lung   Assessment / Plan / Recommendation Clinical Impression  86 yr old following all commands with suspicion for transient aspiration episodes. Sat pt upright and O2 sats fell to 77% briefly then returned to upper 80's then 91%. He self fed using cup and pudding trials. Coordination of swallow and respiration presented challenging at times leading to immediate coughs after water in 2 of 6 trials. Prolonged mastication, bolus formation and transit with cracker. Discussed options for chopped meats or all food items minced. It appeard that he comprehended and choose Dys 2 minced. ST will follow briefly for possible upgrade if pt prefers and use of swallow strategies that therapist provided education. Recommend continue thin, pills whole in puree.         SLP Visit Diagnosis: Dysphagia, oropharyngeal phase (R13.12)    Aspiration Risk  Moderate aspiration risk;Mild aspiration risk    Diet Recommendation Dysphagia 2 (Fine chop);Thin  liquid   Liquid Administration via: Cup;Straw Medication Administration: Whole meds with puree Supervision: Patient able to self feed;Intermittent supervision to cue for compensatory strategies Compensations: Slow rate;Small sips/bites;Lingual sweep for clearance of pocketing Postural Changes: Seated upright at 90 degrees    Other  Recommendations Oral Care Recommendations: Oral care BID   Follow up Recommendations 24 hour supervision/assistance      Frequency and Duration min 1 x/week  2 weeks       Prognosis Prognosis for Safe Diet Advancement: Fair      Swallow Study   General HPI: Nicolas Snyder  is a 86 y.o. male,  w gerd, hypertension, GERD, hyperlipidemia, Dm2, CKD stage 3, Pafib, Hypothyroidism, DNR, covid-19 positive, presents with c/o poor appetite and concern for dehydration at [facility]. Positive Covid test 2/10. CXR Indistinct opacity throughout the right mid and lower lung Type of Study: Bedside Swallow Evaluation Previous Swallow Assessment: (none) Diet Prior to this Study: Regular;Thin liquids Temperature Spikes Noted: No Respiratory Status: Room air History of Recent Intubation: No Behavior/Cognition: Alert;Cooperative;Pleasant mood Oral Cavity Assessment: Other (comment)(lingual candidia) Oral Care Completed by SLP: No Oral Cavity - Dentition: Dentures, top;Dentures, bottom Vision: Functional for self-feeding Self-Feeding Abilities: Able to feed self;Needs set up Patient Positioning: Upright in bed Baseline Vocal Quality: Normal Volitional Cough: Weak Volitional Swallow: Able to elicit    Oral/Motor/Sensory Function Overall Oral Motor/Sensory Function: Within functional limits   Ice Chips Ice chips: Not tested   Thin Liquid Thin Liquid: Impaired Presentation: Cup;Straw Oral Phase Impairments: Reduced labial seal Oral Phase Functional Implications: Right anterior spillage;Left anterior  spillage Pharyngeal  Phase Impairments: Cough - Immediate;Multiple  swallows    Nectar Thick Nectar Thick Liquid: Not tested   Honey Thick Honey Thick Liquid: Not tested   Puree Puree: Within functional limits   Solid     Solid: Impaired Oral Phase Impairments: Impaired mastication Oral Phase Functional Implications: Prolonged oral transit      [place] Siren 08/22/2019,4:03 PM  Orbie Pyo Colvin Caroli.Ed Risk analyst [phone] Office [phone]

## 2019-08-22 NOTE — Progress Notes (Signed)
  Echocardiogram 2D Echocardiogram has been performed.  Nicolas Snyder 08/22/2019, 8:46 AM

## 2019-08-22 NOTE — Progress Notes (Signed)
Xcover Elevated troponin w/o chest pain Trop 1,505-> 1,274 Ekg Afib at 100, RAD, RBBB, Q in 2,3, AVF  Will Check cardiac echo

## 2019-08-22 NOTE — Progress Notes (Signed)
PROGRESS NOTE  Nicolas Snyder:[id number] DOB: Aug 26, 1920 DOA: 08/21/2019 PCP: Haywood Pao, MD  HPI/Recap of past 24 hours: Patient is a 86 year old male with past medical history of atrial fibrillation, diabetes mellitus and hypertension with stage III chronic kidney disease recently seen after a fall and tested positive for Covid 2 days ago.  Since then patient has been weak and lethargic and eating very little.  Patient was brought to the emergency room on the evening of 2/11 with weakness.  Paramedics noted that he was in rapid A. fib and after fluid bolus had improved.  Temperature was 101.  In the emergency room, patient was found to be in severe sepsis secondary to pneumonia and Covid.  His CRP level elevated at 24.  He was given IV antibiotics, Remdisivir and fluids.  Fortunately, his oxygenation was 95% on room air.  Other labs of note were a creatinine of 4.13 which peaked as high as 4.22 (creatinine was 2.19 years ago) and initial white count of 11.5 and high-sensitivity troponin of 1500.  Patient was admitted to the hospitalist service and transferred to [facility].  Following admission, patient's follow-up labs noted downward trend in his troponins, most recent set at 750 this morning.  Creatinine down to 4.  Lactic acid level improved to 1.9 after fluids, but then back up to 2.2 by late morning.  Patient's procalcitonin level also elevated, this morning at 2.25.  Surprisingly, patient himself says he is feeling better than when he first came in with no complaints.  Says his breathing is okay.  Denies any chest pain.  An echocardiogram was ordered given elevated troponins and came back noting systolic heart failure with an EF of 20%.  A previous echocardiogram was done back in 2010 and at that time was unrevealing.  Assessment/Plan: Principal Problem:   Severe sepsis ([place]) secondary to healthcare associated pneumonia: Patient meets criteria with tachycardia, tachypnea,  fever, leukocytosis and lactic acidosis with pulmonary source as well as significant acute kidney injury.  We will continue IV fluids and repeat lactic acid level.  We will retest as healthcare associated pneumonia given he is from a nursing facility and this is more than just Covid, given elevated procalcitonin level.  I am less likely to suspect aspiration pneumonia, but given his advanced age, will have speech therapy see in the meantime cover with Zosyn to cover anaerobes Active Problems:   AF (atrial fibrillation) ([place]): Initially in rapid atrial fibrillation given sepsis but with fluids, has since become more rate controlled.  He is on p.o. Cardizem.  He is not on chronic anticoagulation    DM (diabetes mellitus) ([place]): We will check A1c.  Sliding scale for now.  Expect elevated CBGs given that he is on steroids for Covid.    Hypertension: Blood pressures have been stable despite sepsis.  Would favor   Hyperlipemia: Continue statin.    Hypothyroidism: Continue Synthroid.    AKI (acute kidney injury) ([place]) in the setting of stage III chronic kidney disease: This is likely from sepsis.  The last labs we have are from 2012.  We will touch base with his primary care physician to get a more recent set.  Should still improve with fluids.    Hypokalemia: Replaced.  Follow-up potassium normal.    COVID-19 virus infection: More of an incidental finding.  Might be a contributing factor in his sepsis.  Continue Remdisivir and steroids.  Continue to follow.    Demand ischemia [facility]): Suspect  demand ischemia in the setting of sepsis, especially following treatment these numbers have improved.  Also possible that we could have elevated troponins due to mild rapid atrial fibrillation and in CHF, but would not expect him to get better  Chronic systolic heart failure: Unclear how long he has had heart failure.  We will touch base with his primary care physician.  In the meantime we will check a BNP but for  now, while we are treating his sepsis, would favor fluid resuscitation until lactic acid level stabilized. He is on an ARB/HCTZ, currently on hold due to AKI  Code Status: DNR  Family Communication: Updated by son by phone   Disposition Plan: Covid overall stable.  Home   Consultants:  None   Procedures:  Echocardiogram done 2/12:    Antimicrobials:  IV Doxycycline 2/11-2/12  IV Zosyn 2/12-present   DVT prophylaxis: SQ Heparin   Objective: Vitals:   08/22/19 0721 08/22/19 1130  BP: (!) 135/102 (!) 136/106  Pulse: 96 90  Resp: 16 20  Temp: 98.1 F (36.7 C) 98.4 F (36.9 C)  SpO2: 95% 94%    Intake/Output Summary (Last 24 hours) at 08/22/2019 1238 Last data filed at 08/22/2019 0600 Gross per 24 hour  Intake 911.25 ml  Output --  Net 911.25 ml   Filed Weights   08/22/19 0638  Weight: 65.4 kg   Body mass index is 20.69 kg/m.  Exam:   General:  Alert & oriented x 3, no acute distress   HEENT: Normocephalic, Atraumatic, mucous membranes are dry  Neck: supple, no JVD  Cardiovascular: Irregular rhythm, rate controlled   Respiratory: Decreased breath sounds bibasilar   Abdomen: Soft, nontender, non-distended, +BS   Musculoskeletal: No clubbing, cyanosis.  Trade pitting edema   Skin: No skin breaks, tears or lesions  Psychiatry: Appropriate, no evidence of psychosis    Data Reviewed: CBC: Recent Labs  Lab 08/21/19 1651 08/22/19 0045  WBC 11.5* 14.6*  NEUTROABS 10.5* 13.9*  HGB 14.6 14.6  HCT 43.6 42.8  MCV 91.2 91.3  PLT 175 573   Basic Metabolic Panel: Recent Labs  Lab 08/21/19 1651 08/21/19 2216 08/22/19 0045  NA 133*  --  132*  K 3.1*  --  3.5  CL 96*  --  97*  CO2 22  --  19*  GLUCOSE 258*  --  197*  BUN 93*  --  99*  CREATININE 4.13* 4.22* 4.00*  CALCIUM 7.8*  --  7.7*   GFR: Estimated Creatinine Clearance: 10 mL/min (A) (by C-G formula based on SCr of 4 mg/dL (H)). Liver Function Tests: Recent Labs  Lab  08/21/19 1651 08/22/19 0045  AST 47* 49*  ALT 27 25  ALKPHOS 30* 33*  BILITOT 1.8* 1.3*  PROT 5.7* 5.6*  ALBUMIN 2.4* 2.4*   No results for input(s): LIPASE, AMYLASE in the last 168 hours. No results for input(s): AMMONIA in the last 168 hours. Coagulation Profile: No results for input(s): INR, PROTIME in the last 168 hours. Cardiac Enzymes: No results for input(s): CKTOTAL, CKMB, CKMBINDEX, TROPONINI in the last 168 hours. BNP (last 3 results) No results for input(s): PROBNP in the last 8760 hours. HbA1C: No results for input(s): HGBA1C in the last 72 hours. CBG: Recent Labs  Lab 08/21/19 2158 08/22/19 0721 08/22/19 1132  GLUCAP 222* 233* 228*   Lipid Profile: Recent Labs    08/21/19 1746  TRIG 290*   Thyroid Function Tests: No results for input(s): TSH, T4TOTAL, FREET4, T3FREE, THYROIDAB in  the last 72 hours. Anemia Panel: Recent Labs    08/21/19 1701  FERRITIN 3,218*   Urine analysis:    Component Value Date/Time   COLORURINE YELLOW 08/21/2019 2230   APPEARANCEUR HAZY (A) 08/21/2019 2230   LABSPEC 1.014 08/21/2019 2230   PHURINE 5.0 08/21/2019 2230   GLUCOSEU NEGATIVE 08/21/2019 2230   HGBUR SMALL (A) 08/21/2019 2230   BILIRUBINUR NEGATIVE 08/21/2019 2230   KETONESUR NEGATIVE 08/21/2019 2230   PROTEINUR 100 (A) 08/21/2019 2230   UROBILINOGEN 0.2 10/07/2010 1715   NITRITE NEGATIVE 08/21/2019 2230   LEUKOCYTESUR TRACE (A) 08/21/2019 2230   Sepsis Labs: @LABRCNTIP (procalcitonin:4,lacticidven:4)  ) Recent Results (from the past 240 hour(s))  Blood Culture (routine x 2)     Status: None (Preliminary result)   Collection Time: 08/21/19  4:45 PM   Specimen: BLOOD  Result Value Ref Range Status   Specimen Description BLOOD LEFT ANTECUBITAL  Final   Special Requests   Final    BOTTLES DRAWN AEROBIC AND ANAEROBIC Blood Culture adequate volume   Culture   Final    NO GROWTH < 24 HOURS Performed at [facility], [place] [address] [place], [place]  [address]    Report Status PENDING  Incomplete  Blood Culture (routine x 2)     Status: None (Preliminary result)   Collection Time: 08/21/19  6:00 PM   Specimen: BLOOD RIGHT ARM  Result Value Ref Range Status   Specimen Description BLOOD RIGHT ARM  Final   Special Requests   Final    BOTTLES DRAWN AEROBIC AND ANAEROBIC Blood Culture adequate volume   Culture   Final    NO GROWTH < 24 HOURS Performed at [facility], [place] [address], [place], [place] [address]    Report Status PENDING  Incomplete      Studies: US Abdomen Complete  Result Date: 08/21/2019 CLINICAL DATA:  Acute renal failure, abnormal liver function tests, COVID-19 positive EXAM: ABDOMEN ULTRASOUND COMPLETE COMPARISON:  10/05/2010 FINDINGS: Gallbladder: No gallstones or wall thickening visualized. No sonographic Murphy sign noted by sonographer. Common bile duct: Diameter: 4 mm Liver: No focal lesion identified. Within normal limits in parenchymal echogenicity. Portal vein is patent on color Doppler imaging with normal direction of blood flow towards the liver. IVC: No abnormality visualized. Pancreas: Obscured due to bowel gas. Spleen: Size and appearance within normal limits. Right Kidney: Length: 9.5. There is right renal cortical thinning. Echotexture is normal. Multiple peripelvic and cortical cysts are identified, largest in the lower pole measuring 2.8 cm. Left Kidney: Length: 8.7. There is left renal cortical thinning. Echotexture is normal. No masses. Abdominal aorta: The infrarenal abdominal aorta measures 3.3 cm in maximal anterior-posterior dimension in the sagittal plane. Other findings: No free fluid. Incidental small right pleural effusion. IMPRESSION: 1. Bilateral renal cortical atrophy. 2. 3.3 cm infrarenal abdominal aortic aneurysm. 3. Trace right pleural effusion. Electronically Signed   By: Randa Ngo M.D.   On: 08/21/2019 22:24   DG Chest Port 1 View  Result Date: 08/21/2019 CLINICAL DATA:   86 year old male with fever, positive COVID-19. EXAM: PORTABLE CHEST 1 VIEW COMPARISON:  Chest radiographs 10/05/2010. FINDINGS: Portable AP semi upright view at 1723 hours. Patchy and confluent opacity in the right mid and lower lung, most pronounced in the lung periphery. Elsewhere when allowing for portable technique the lungs appear clear. Stable lung volumes. No pleural effusion is evident. Normal cardiac size and mediastinal contours. Visualized tracheal air column is within normal limits. Medial right  upper lung calcified granuloma. Negative visible bowel gas pattern. No acute osseous abnormality identified. IMPRESSION: Indistinct opacity throughout the right mid and lower lung suspicious for COVID-19 pneumonia in this setting. No pleural effusion. Electronically Signed   By: Genevie Ann M.D.   On: 08/21/2019 17:37   ECHOCARDIOGRAM COMPLETE  Result Date: 08/22/2019    ECHOCARDIOGRAM REPORT   Patient Name:   Nicolas Snyder Date of Exam: 08/22/2019 Medical Rec #:  [id number]     Height:       70.0 in Accession #:    [id number]    Weight:       144.2 lb Date of Birth:  Apr 04, 1921      BSA:          1.82 m Patient Age:    43 years      BP:           135/102 mmHg Patient Gender: M             HR:           105 bpm. Exam Location:  Inpatient Procedure: 2D Echo Indications:    elevated troponin  History:        Patient has no prior history of Echocardiogram examinations.                 Covid, Arrythmias:Atrial Fibrillation; Risk Factors:Diabetes,                 Hypertension and Dyslipidemia.  Sonographer:    Johny Chess Referring Phys: [place]  1. Septal apical mid and apical inferior wall akinesis . Left ventricular ejection fraction, by estimation, is 25 to 30%. The left ventricle has severely decreased function. The left ventricle demonstrates regional wall motion abnormalities (see scoring  diagram/findings for description). The left ventricular internal cavity size was moderately dilated.  Left ventricular diastolic parameters are indeterminate.  2. Right ventricular systolic function is normal. The right ventricular size is normal. There is moderately elevated pulmonary artery systolic pressure.  3. Left atrial size was moderately dilated.  4. Right atrial size was moderately dilated.  5. The mitral valve is normal in structure and function. Moderate mitral valve regurgitation. No evidence of mitral stenosis.  6. Tricuspid valve regurgitation is moderate.  7. The aortic valve is tricuspid. Aortic valve regurgitation is not visualized. Mild to moderate aortic valve sclerosis/calcification is present, without any evidence of aortic stenosis. FINDINGS  Left Ventricle: Septal apical mid and apical inferior wall akinesis. Left ventricular ejection fraction, by estimation, is 25 to 30%. The left ventricle has severely decreased function. The left ventricle demonstrates regional wall motion abnormalities.  The left ventricular internal cavity size was moderately dilated. There is no left ventricular hypertrophy. Left ventricular diastolic parameters are indeterminate. Right Ventricle: The right ventricular size is normal. No increase in right ventricular wall thickness. Right ventricular systolic function is normal. There is moderately elevated pulmonary artery systolic pressure. The tricuspid regurgitant velocity is 3.14 m/s, and with an assumed right atrial pressure of 3 mmHg, the estimated right ventricular systolic pressure is 82.9 mmHg. Left Atrium: Left atrial size was moderately dilated. Right Atrium: Right atrial size was moderately dilated. Pericardium: There is no evidence of pericardial effusion. Mitral Valve: The mitral valve is normal in structure and function. There is mild thickening of the mitral valve leaflet(s). Normal mobility of the mitral valve leaflets. Moderate mitral valve regurgitation. No evidence of mitral valve stenosis. Tricuspid Valve: The tricuspid valve is normal  in  structure. Tricuspid valve regurgitation is moderate . No evidence of tricuspid stenosis. Aortic Valve: The aortic valve is tricuspid. Aortic valve regurgitation is not visualized. Mild to moderate aortic valve sclerosis/calcification is present, without any evidence of aortic stenosis. Pulmonic Valve: The pulmonic valve was normal in structure. Pulmonic valve regurgitation is not visualized. No evidence of pulmonic stenosis. Aorta: The aortic root is normal in size and structure. IAS/Shunts: No atrial level shunt detected by color flow Doppler.  LEFT VENTRICLE PLAX 2D LVIDd:         4.35 cm LVIDs:         3.62 cm LV PW:         1.12 cm LV IVS:        0.96 cm LVOT diam:     2.00 cm LV SV Index:   16.86 LVOT Area:     3.14 cm  LEFT ATRIUM             Index       RIGHT ATRIUM           Index LA diam:        4.60 cm 2.53 cm/m  RA Area:     18.50 cm LA Vol (A2C):   73.5 ml 40.47 ml/m RA Volume:   54.40 ml  29.95 ml/m LA Vol (A4C):   59.4 ml 32.70 ml/m LA Biplane Vol: 66.6 ml 36.67 ml/m   AORTA Ao Root diam: 3.10 cm Ao Asc diam:  3.10 cm TRICUSPID VALVE TR Peak grad:   39.4 mmHg TR Vmax:        314.00 cm/s  SHUNTS Systemic Diam: 2.00 cm Jenkins Rouge MD Electronically signed by Jenkins Rouge MD Signature Date/Time: 08/22/2019/9:01:52 AM    Final     Scheduled Meds: . aspirin EC  81 mg Oral Daily  . dexamethasone (DECADRON) injection  6 mg Intravenous Q24H  . diltiazem  360 mg Oral Daily  . docusate sodium  100 mg Oral Q12H  . feeding supplement (PRO-STAT SUGAR FREE 64)  30 mL Oral BID  . fenofibrate  54 mg Oral Daily  . heparin  5,000 Units Subcutaneous Q8H  . insulin aspart  0-5 Units Subcutaneous QHS  . insulin aspart  0-9 Units Subcutaneous TID WC  . levothyroxine  75 mcg Oral Daily  . pantoprazole  40 mg Oral Daily  . pravastatin  10 mg Oral Daily  . tamsulosin  0.4 mg Oral QPC supper    Continuous Infusions: . sodium chloride    . remdesivir 100 mg in NS 100 mL 100 mg (08/22/19 0903)      LOS: 1 day     Annita Brod, MD [facility]   08/22/2019, 12:38 PM

## 2019-08-22 NOTE — Progress Notes (Signed)
Initial Nutrition Assessment  DOCUMENTATION CODES:   Not applicable  INTERVENTION:    Ensure Enlive po TID, each supplement provides 350 kcal and 20 grams of protein   Liberalize diet to regular.   Pt receiving Hormel Shake daily with Breakfast which provides 520 kcals and 22 g of protein and Magic cup BID with lunch and dinner, each supplement provides 290 kcal and 9 grams of protein, automatically on meal trays to optimize nutritional intake.   NUTRITION DIAGNOSIS:   Increased nutrient needs related to acute illness, catabolic illness(COVID) as evidenced by estimated needs.  GOAL:   Patient will meet greater than or equal to 90% of their needs  MONITOR:   PO intake, Supplement acceptance, Skin  REASON FOR ASSESSMENT:   Consult Assessment of nutrition requirement/status  ASSESSMENT:   86 yo male admitted from SNF with severe sepsis d/t PNA and COVID. PMH includes A fib, DM, HTN, HLD, hypothyroidism, GERD.  Unable to reach patient by phone.  He is on a heart healthy, CHO modified diet. No meal intakes recorded. Patient was eating poorly PTA. BMI is on the low side for advanced age. Suspect patient is malnourished. Patient would benefit from nutrient dense PO supplements to help meet increased nutrition needs for COVID-19.  Swallow evaluation with SLP has been ordered.   Labs reviewed. Na 132 (L) CBG's: 233-228  Medications reviewed and include decadron, colace, novolog, flomax, remdesivir.   NUTRITION - FOCUSED PHYSICAL EXAM:  unable to complete  Diet Order:   Diet Order            Diet heart healthy/carb modified Room service appropriate? Yes; Fluid consistency: Thin  Diet effective now              EDUCATION NEEDS:   Not appropriate for education at this time  Skin:  Skin Assessment: Reviewed RN Assessment  Last BM:  no BM documented  Height:   Ht Readings from Last 1 Encounters:  08/22/19 5\' 10"  (1.778 m)    Weight:   Wt Readings from  Last 1 Encounters:  08/22/19 65.4 kg    Ideal Body Weight:  75.5 kg  BMI:  Body mass index is 20.69 kg/m.  Estimated Nutritional Needs:   Kcal:  6629-4765  Protein:  85-100 gm  Fluid:  >/= 1.7 L    Molli Barrows, RD, LDN, CNSC Please refer to Amion for contact information.

## 2019-08-22 NOTE — ED Notes (Signed)
Breakfast Ordered 

## 2019-08-22 NOTE — Progress Notes (Signed)
Pharmacy Antibiotic Note  Nicolas Snyder is a 86 y.o. male admitted on 08/21/2019 with sepsis, HCAP, found to have COVID-19 infection.  Pharmacy has been consulted for Cefepime dosing. WBC 14.6, Tm 101.8, PCT 2.25, LA 2.2  PCN allergy noted (Eye swelling):  Son, Nicolas Snyder unable to provide more information.  [facility] pharmacy doesn't have any reactions listed for PCN allergy.  [facility] facility doesn't have any allergies listed.  Plan: Cefepime 1g IV q24h Azithromycin 500mg  IV q24h.  Height: 5\' 10"  (177.8 cm) Weight: 144 lb 2.9 oz (65.4 kg) IBW/kg (Calculated) : 73  Temp (24hrs), Avg:99.3 F (37.4 C), Min:98.1 F (36.7 C), Max:101.8 F (38.8 C)  Recent Labs  Lab 08/21/19 1651 08/21/19 1800 08/21/19 2216 08/22/19 0045 08/22/19 1015  WBC 11.5*  --   --  14.6*  --   CREATININE 4.13*  --  4.22* 4.00*  --   LATICACIDVEN  --  1.9  --   --  2.2*    Estimated Creatinine Clearance: 10 mL/min (A) (by C-G formula based on SCr of 4 mg/dL (H)).    Allergies  Allergen Reactions  . Penicillins Hives, Swelling and Other (See Comments)    Eye swelling Did it involve swelling of the face/tongue/throat, SOB, or low BP? Unknown Did it involve sudden or severe rash/hives, skin peeling, or any reaction on the inside of your mouth or nose? Unknown Did you need to seek medical attention at a hospital or doctor's office? Unknown When did it last happen?"Many years ago" If all above answers are "NO", may proceed with cephalosporin use.     Antimicrobials this admission: 2/11 Remdesivir >> (2/15) 2/11 Doxy >> 2/12 2/12 Cefepime >>  2/12 Azithromycin >>   Microbiology results: 2/11 BCx:  ngtd 2/11 Strep urine antigen negative 2/11 Legionella Urine antitgen Pending   Thank you for allowing pharmacy to be a part of this patient's care.  Gretta Arab PharmD, BCPS Clinical pharmacist phone 7am- 5pm: [phone] 08/22/2019 1:29 PM

## 2019-08-22 NOTE — Plan of Care (Signed)
Return to [place]

## 2019-08-23 LAB — GLUCOSE, CAPILLARY
Glucose-Capillary: 274 mg/dL — ABNORMAL HIGH (ref 70–99)
Glucose-Capillary: 338 mg/dL — ABNORMAL HIGH (ref 70–99)
Glucose-Capillary: 312 mg/dL — ABNORMAL HIGH (ref 70–99)
Glucose-Capillary: 282 mg/dL — ABNORMAL HIGH (ref 70–99)

## 2019-08-23 LAB — COMPREHENSIVE METABOLIC PANEL
ALT: 30 U/L (ref 0–44)
AST: 46 U/L — ABNORMAL HIGH (ref 15–41)
Albumin: 2.2 g/dL — ABNORMAL LOW (ref 3.5–5.0)
Alkaline Phosphatase: 33 U/L — ABNORMAL LOW (ref 38–126)
Anion gap: 14 (ref 5–15)
BUN: 106 mg/dL — ABNORMAL HIGH (ref 8–23)
CO2: 19 mmol/L — ABNORMAL LOW (ref 22–32)
Calcium: 7.4 mg/dL — ABNORMAL LOW (ref 8.9–10.3)
Chloride: 101 mmol/L (ref 98–111)
Creatinine, Ser: 3.59 mg/dL — ABNORMAL HIGH (ref 0.61–1.24)
GFR calc Af Amer: 16 mL/min — ABNORMAL LOW (ref >60)
GFR calc non Af Amer: 13 mL/min — ABNORMAL LOW (ref >60)
Glucose, Bld: 223 mg/dL — ABNORMAL HIGH (ref 70–99)
Potassium: 3.1 mmol/L — ABNORMAL LOW (ref 3.5–5.1)
Sodium: 134 mmol/L — ABNORMAL LOW (ref 135–145)
Total Bilirubin: 0.8 mg/dL (ref 0.3–1.2)
Total Protein: 5.2 g/dL — ABNORMAL LOW (ref 6.5–8.1)

## 2019-08-23 LAB — CBC WITH DIFFERENTIAL/PLATELET
Abs Immature Granulocytes: 0.09 10*3/uL — ABNORMAL HIGH (ref 0.00–0.07)
Basophils Absolute: 0 10*3/uL (ref 0.0–0.1)
Basophils Relative: 0 %
Eosinophils Absolute: 1.8 10*3/uL — ABNORMAL HIGH (ref 0.0–0.5)
Eosinophils Relative: 15 %
HCT: 39.5 % (ref 39.0–52.0)
Hemoglobin: 13.3 g/dL (ref 13.0–17.0)
Immature Granulocytes: 1 %
Lymphocytes Relative: 2 %
Lymphs Abs: 0.2 10*3/uL — ABNORMAL LOW (ref 0.7–4.0)
MCH: 30.4 pg (ref 26.0–34.0)
MCHC: 33.7 g/dL (ref 30.0–36.0)
MCV: 90.2 fL (ref 80.0–100.0)
Monocytes Absolute: 0.3 10*3/uL (ref 0.1–1.0)
Monocytes Relative: 3 %
Neutro Abs: 9.1 10*3/uL — ABNORMAL HIGH (ref 1.7–7.7)
Neutrophils Relative %: 79 %
Platelets: 202 10*3/uL (ref 150–400)
RBC: 4.38 MIL/uL (ref 4.22–5.81)
RDW: 13.1 % (ref 11.5–15.5)
WBC Morphology: INCREASED
WBC: 11.6 10*3/uL — ABNORMAL HIGH (ref 4.0–10.5)
nRBC: 0 % (ref 0.0–0.2)

## 2019-08-23 LAB — BASIC METABOLIC PANEL
Anion gap: 15 (ref 5–15)
BUN: 95 mg/dL — ABNORMAL HIGH (ref 8–23)
CO2: 18 mmol/L — ABNORMAL LOW (ref 22–32)
Calcium: 7.3 mg/dL — ABNORMAL LOW (ref 8.9–10.3)
Chloride: 101 mmol/L (ref 98–111)
Creatinine, Ser: 3.7 mg/dL — ABNORMAL HIGH (ref 0.61–1.24)
GFR calc Af Amer: 15 mL/min — ABNORMAL LOW (ref >60)
GFR calc non Af Amer: 13 mL/min — ABNORMAL LOW (ref >60)
Glucose, Bld: 320 mg/dL — ABNORMAL HIGH (ref 70–99)
Potassium: 3.2 mmol/L — ABNORMAL LOW (ref 3.5–5.1)
Sodium: 134 mmol/L — ABNORMAL LOW (ref 135–145)

## 2019-08-23 LAB — TROPONIN I (HIGH SENSITIVITY): Troponin I (High Sensitivity): 220 ng/L (ref <18)

## 2019-08-23 LAB — HEMOGLOBIN A1C
Hgb A1c MFr Bld: 8.7 % — ABNORMAL HIGH (ref 4.8–5.6)
Mean Plasma Glucose: 202.99 mg/dL

## 2019-08-23 LAB — D-DIMER, QUANTITATIVE: D-Dimer, Quant: 1.26 ug/mL-FEU — ABNORMAL HIGH (ref 0.00–0.50)

## 2019-08-23 LAB — FERRITIN: Ferritin: 3498 ng/mL — ABNORMAL HIGH (ref 24–336)

## 2019-08-23 LAB — LACTIC ACID, PLASMA: Lactic Acid, Venous: 1.6 mmol/L (ref 0.5–1.9)

## 2019-08-23 LAB — C-REACTIVE PROTEIN: CRP: 19 mg/dL — ABNORMAL HIGH (ref <1.0)

## 2019-08-23 LAB — PROCALCITONIN: Procalcitonin: 1.78 ng/mL

## 2019-08-23 MED ORDER — ALBUMIN HUMAN 25 % IV SOLN
12.5000 g | Freq: Once | INTRAVENOUS | Status: AC
  Administered 2019-08-23: 12.5 g via INTRAVENOUS
  Filled 2019-08-23: qty 50

## 2019-08-23 MED ORDER — INSULIN GLARGINE 100 UNIT/ML ~~LOC~~ SOLN
5.0000 [IU] | Freq: Every day | SUBCUTANEOUS | Status: DC
  Administered 2019-08-23: 5 [IU] via SUBCUTANEOUS
  Filled 2019-08-23 (×2): qty 0.05

## 2019-08-23 MED ORDER — POTASSIUM CHLORIDE CRYS ER 20 MEQ PO TBCR
20.0000 meq | EXTENDED_RELEASE_TABLET | Freq: Once | ORAL | Status: AC
  Administered 2019-08-23: 20 meq via ORAL
  Filled 2019-08-23: qty 1

## 2019-08-23 MED ORDER — INSULIN GLARGINE 100 UNIT/ML ~~LOC~~ SOLN
4.0000 [IU] | Freq: Every day | SUBCUTANEOUS | Status: DC
  Filled 2019-08-23: qty 0.04

## 2019-08-23 NOTE — Progress Notes (Addendum)
Spoke to son and daughter in law on the phone today. Pt continuing to receive NS at 125 ml/hr for hydration and to improve renal function. Albumin, potassium oral supp for level of 3.1, given. Pt is awake and oriented 2x, appropriate; difficult prone position due to pt complaint of left chest "pulling." 2 liters [place] with sat 85-93%, right anterior/posterior chest with coarse crackles. PIV site dressing change, left elbow wound care due to existing skin tear.

## 2019-08-23 NOTE — Progress Notes (Addendum)
PROGRESS NOTE  Nicolas Snyder RSW:[id number] DOB: 1920-10-25 DOA: 08/21/2019 PCP: Haywood Pao, MD  HPI/Recap of past 24 hours: Patient is a 86 year old male from ALF with past medical history of atrial fibrillation, diabetes mellitus and hypertension with stage III chronic kidney disease recently seen after a fall and tested positive for Covid 2 days prior.  Since then patient has been weak and lethargic and eating very little.  Patient was brought to the emergency room on the evening of 2/11 with weakness.  Paramedics noted that he was in rapid A. fib and after fluid bolus had improved.  Temperature was 101.  In the emergency room, patient was found to be in severe sepsis secondary to pneumonia and Covid.  His CRP level elevated at 24.  He was given IV antibiotics, Remdisivir and fluids.  Fortunately, his oxygenation was 95% on room air.  Other labs of note were a creatinine of 4.13 which peaked as high as 4.22 (creatinine was 2.19 years ago) and initial white count of 11.5 and high-sensitivity troponin of 1500.  Patient was admitted to the hospitalist service and transferred to [facility].  An echocardiogram was ordered given elevated troponins and came back noting systolic/diastolic heart failure with an EF of 20%.  Since admission, patient's creatinine has been slowly improving as has lactic acid.  Troponin continues to trend downward.  Patient himself with no complaints.  Assessment/Plan: Principal Problem:   Severe sepsis ([place]) secondary to healthcare associated pneumonia: Patient meets criteria with tachycardia, tachypnea, fever, leukocytosis and lactic acidosis with pulmonary source as well as significant acute kidney injury.  Treated with IV fluids & IV antibiotics.  We will treat this as healthcare associated pneumonia given he is from a nursing facility and this is more than just Covid, given elevated procalcitonin level.  I am less likely to suspect aspiration pneumonia, but  given his advanced age, will have speech therapy see in the meantime cover with Zosyn to cover anaerobes.  Active Problems:   AF (atrial fibrillation) ([place]): Initially in rapid atrial fibrillation given sepsis but with fluids, has since become more rate controlled.  He is on p.o. Cardizem.  He is not on chronic anticoagulation  Dysphagia: Seen by speech therapy and put on a dysphagia 2 diet with thin liquids    DM (diabetes mellitus) ([place]): We will check A1c.  Sliding scale for now.  Expect elevated CBGs given that he is on steroids for Covid.  Reported A1c as outpatient last year was 8.4.    Hypertension: Blood pressures have been stable despite sepsis.  Would favor   Hyperlipemia: Continue statin.    Hypothyroidism: Continue Synthroid.    AKI (acute kidney injury) ([place]) in the setting of stage III/IV chronic kidney disease: This is likely from sepsis.  Spoke with her primary care doctor's partner and patient has baseline creatinine of 2.9.  Given slow improvement in his renal function, suspect he is having third spacing so gave dose of IV albumin on 2/13.    Hypokalemia: Replace potassium as needed.    COVID-19 virus infection: More of an incidental finding.  Might be a contributing factor in his sepsis.  Continue Remdisivir and steroids.  (Final dose of remdesivir on 2/15 ) CRP still quite elevated at 14.0.    Demand ischemia [facility]): Suspect demand ischemia in the setting of sepsis, especially following treatment these numbers have improved.  Also possible that we could have elevated troponins due to mild rapid atrial fibrillation and  in CHF, but would not expect him to get better  Chronic systolic/diastolic heart failure: Unclear how long he has had heart failure.  We will touch base with his primary care physician.  BNP mildly elevated, and this is more likely in the sense of his renal dysfunction.  However, with fluid resuscitation I suspect given his poor ejection fraction, we will likely  become volume overloaded and will need to diurese him before he is discharged. He is on an ARB/HCTZ, currently on hold due to AKI  Code Status: DNR  Family Communication: Updated son by phone  Disposition Plan: Covid overall stable.  Home once creatinine back to baseline (3.0) & then fully diuresed.  Return to Assisted Living.    Consultants:  None   Procedures:  Echocardiogram done 2/12: Decreased ejection fraction of 93% plus diastolic dysfunction  Antimicrobials:  IV Doxycycline 2/11-2/12  IV Zithromax 2/12-present  IV cefepime 2/12-present  DVT prophylaxis: SQ Heparin   Objective: Vitals:   08/23/19 0739 08/23/19 1143  BP: 117/71 118/62  Pulse: (!) 47 (!) 36  Resp:  17  Temp: (!) 97.5 F (36.4 C) 98 F (36.7 C)  SpO2: 91% 93%    Intake/Output Summary (Last 24 hours) at 08/23/2019 1423 Last data filed at 08/23/2019 1304 Gross per 24 hour  Intake 1980 ml  Output 150 ml  Net 1830 ml   Filed Weights   08/22/19 0638  Weight: 65.4 kg   Body mass index is 20.69 kg/m.  Exam:   General:  Alert & oriented x 3, slightly somnolent  HEENT: Normocephalic, Atraumatic, mucous membranes are dry  Neck: supple, no JVD  Cardiovascular: Irregular rhythm, borderline bradycardia  Respiratory: Decreased breath sounds bibasilar   Abdomen: Soft, nontender, non-distended, +BS   Musculoskeletal: No clubbing, cyanosis.  Trade pitting edema   Skin: No skin breaks, tears or lesions  Psychiatry: Appropriate, no evidence of psychosis    Data Reviewed: CBC: Recent Labs  Lab 08/21/19 1651 08/22/19 0045 08/22/19 1243 08/23/19 0207  WBC 11.5* 14.6* 16.5* 11.6*  NEUTROABS 10.5* 13.9*  --  9.1*  HGB 14.6 14.6 15.7 13.3  HCT 43.6 42.8 47.3 39.5  MCV 91.2 91.3 92.0 90.2  PLT 175 182 200 818   Basic Metabolic Panel: Recent Labs  Lab 08/21/19 1651 08/21/19 2216 08/22/19 0045 08/22/19 1243 08/23/19 0207  NA 133*  --  132* 135 134*  K 3.1*  --  3.5 3.8 3.1*   CL 96*  --  97* 97* 101  CO2 22  --  19* 20* 19*  GLUCOSE 258*  --  197* 218* 223*  BUN 93*  --  99* 99* 106*  CREATININE 4.13* 4.22* 4.00* 4.20* 3.59*  CALCIUM 7.8*  --  7.7* 7.8* 7.4*   GFR: Estimated Creatinine Clearance: 11.1 mL/min (A) (by C-G formula based on SCr of 3.59 mg/dL (H)). Liver Function Tests: Recent Labs  Lab 08/21/19 1651 08/22/19 0045 08/23/19 0207  AST 47* 49* 46*  ALT 27 25 30   ALKPHOS 30* 33* 33*  BILITOT 1.8* 1.3* 0.8  PROT 5.7* 5.6* 5.2*  ALBUMIN 2.4* 2.4* 2.2*   No results for input(s): LIPASE, AMYLASE in the last 168 hours. No results for input(s): AMMONIA in the last 168 hours. Coagulation Profile: No results for input(s): INR, PROTIME in the last 168 hours. Cardiac Enzymes: No results for input(s): CKTOTAL, CKMB, CKMBINDEX, TROPONINI in the last 168 hours. BNP (last 3 results) No results for input(s): PROBNP in the last 8760 hours. HbA1C:  No results for input(s): HGBA1C in the last 72 hours. CBG: Recent Labs  Lab 08/22/19 1132 08/22/19 1630 08/22/19 2057 08/23/19 0729 08/23/19 1140  GLUCAP 228* 251* 203* 274* 338*   Lipid Profile: Recent Labs    08/21/19 1746  TRIG 290*   Thyroid Function Tests: No results for input(s): TSH, T4TOTAL, FREET4, T3FREE, THYROIDAB in the last 72 hours. Anemia Panel: Recent Labs    08/22/19 1015 08/23/19 0207  FERRITIN 3,844* 3,498*   Urine analysis:    Component Value Date/Time   COLORURINE YELLOW 08/21/2019 2230   APPEARANCEUR HAZY (A) 08/21/2019 2230   LABSPEC 1.014 08/21/2019 2230   PHURINE 5.0 08/21/2019 2230   GLUCOSEU NEGATIVE 08/21/2019 2230   HGBUR SMALL (A) 08/21/2019 2230   BILIRUBINUR NEGATIVE 08/21/2019 2230   KETONESUR NEGATIVE 08/21/2019 2230   PROTEINUR 100 (A) 08/21/2019 2230   UROBILINOGEN 0.2 10/07/2010 1715   NITRITE NEGATIVE 08/21/2019 2230   LEUKOCYTESUR TRACE (A) 08/21/2019 2230   Sepsis Labs: @LABRCNTIP (procalcitonin:4,lacticidven:4)  ) Recent Results (from  the past 240 hour(s))  Blood Culture (routine x 2)     Status: None (Preliminary result)   Collection Time: 08/21/19  4:45 PM   Specimen: BLOOD  Result Value Ref Range Status   Specimen Description BLOOD LEFT ANTECUBITAL  Final   Special Requests   Final    BOTTLES DRAWN AEROBIC AND ANAEROBIC Blood Culture adequate volume   Culture   Final    NO GROWTH < 24 HOURS Performed at [facility], [place] [address] [place], [place] [address]    Report Status PENDING  Incomplete  Blood Culture (routine x 2)     Status: None (Preliminary result)   Collection Time: 08/21/19  6:00 PM   Specimen: BLOOD RIGHT ARM  Result Value Ref Range Status   Specimen Description BLOOD RIGHT ARM  Final   Special Requests   Final    BOTTLES DRAWN AEROBIC AND ANAEROBIC Blood Culture adequate volume   Culture   Final    NO GROWTH < 24 HOURS Performed at [facility], [place] [address] [place], [place] [address]    Report Status PENDING  Incomplete      Studies: No results found.  Scheduled Meds: . aspirin EC  81 mg Oral Daily  . dexamethasone (DECADRON) injection  6 mg Intravenous Q24H  . diltiazem  360 mg Oral Daily  . docusate sodium  100 mg Oral Q12H  . feeding supplement (ENSURE ENLIVE)  237 mL Oral TID BM  . feeding supplement (PRO-STAT SUGAR FREE 64)  30 mL Oral BID  . fenofibrate  54 mg Oral Daily  . heparin  5,000 Units Subcutaneous Q8H  . insulin aspart  0-5 Units Subcutaneous QHS  . insulin aspart  0-9 Units Subcutaneous TID WC  . insulin glargine  4 Units Subcutaneous QHS  . levothyroxine  75 mcg Oral Daily  . pantoprazole  40 mg Oral Daily  . pravastatin  10 mg Oral Daily  . tamsulosin  0.4 mg Oral QPC supper    Continuous Infusions: . sodium chloride 125 mL/hr at 08/23/19 1409  . azithromycin 500 mg (08/23/19 1304)  . ceFEPime (MAXIPIME) IV 1 g (08/23/19 1409)  . remdesivir 100 mg in NS 100 mL 100 mg (08/23/19 0836)     LOS: 2 days     Annita Brod, MD [facility]   08/23/2019, 2:23 PM

## 2019-08-23 NOTE — Progress Notes (Signed)
Dr. Maryland Pink notified of pt troponin level of 220. Pt currently eating dinner and requesting for coffee. VSS.

## 2019-08-23 NOTE — Progress Notes (Signed)
Talked with patient at the first of the shift about using an external catheter to keep patient dry but refused saying he did not feel it was necessary.  After patient incontinent at 0200 this morning patient agreed, external catheter placed. Patient was shaved prior to placing and peri care performed.

## 2019-08-24 ENCOUNTER — Other Ambulatory Visit: Payer: Self-pay

## 2019-08-24 LAB — CBC WITH DIFFERENTIAL/PLATELET
Abs Immature Granulocytes: 0.24 10*3/uL — ABNORMAL HIGH (ref 0.00–0.07)
Basophils Absolute: 0 10*3/uL (ref 0.0–0.1)
Basophils Relative: 0 %
Eosinophils Absolute: 0 10*3/uL (ref 0.0–0.5)
Eosinophils Relative: 0 %
HCT: 39.6 % (ref 39.0–52.0)
Hemoglobin: 13.3 g/dL (ref 13.0–17.0)
Immature Granulocytes: 2 %
Lymphocytes Relative: 2 %
Lymphs Abs: 0.2 10*3/uL — ABNORMAL LOW (ref 0.7–4.0)
MCH: 30.6 pg (ref 26.0–34.0)
MCHC: 33.6 g/dL (ref 30.0–36.0)
MCV: 91 fL (ref 80.0–100.0)
Monocytes Absolute: 0.3 10*3/uL (ref 0.1–1.0)
Monocytes Relative: 2 %
Neutro Abs: 10.7 10*3/uL — ABNORMAL HIGH (ref 1.7–7.7)
Neutrophils Relative %: 94 %
Platelets: 240 10*3/uL (ref 150–400)
RBC: 4.35 MIL/uL (ref 4.22–5.81)
RDW: 13.2 % (ref 11.5–15.5)
WBC: 11.5 10*3/uL — ABNORMAL HIGH (ref 4.0–10.5)
nRBC: 0 % (ref 0.0–0.2)

## 2019-08-24 LAB — GLUCOSE, CAPILLARY
Glucose-Capillary: 359 mg/dL — ABNORMAL HIGH (ref 70–99)
Glucose-Capillary: 355 mg/dL — ABNORMAL HIGH (ref 70–99)
Glucose-Capillary: 301 mg/dL — ABNORMAL HIGH (ref 70–99)
Glucose-Capillary: 276 mg/dL — ABNORMAL HIGH (ref 70–99)
Glucose-Capillary: 224 mg/dL — ABNORMAL HIGH (ref 70–99)
Glucose-Capillary: 182 mg/dL — ABNORMAL HIGH (ref 70–99)
Glucose-Capillary: 173 mg/dL — ABNORMAL HIGH (ref 70–99)
Glucose-Capillary: 157 mg/dL — ABNORMAL HIGH (ref 70–99)

## 2019-08-24 LAB — BRAIN NATRIURETIC PEPTIDE: B Natriuretic Peptide: 324 pg/mL — ABNORMAL HIGH (ref 0.0–100.0)

## 2019-08-24 LAB — COMPREHENSIVE METABOLIC PANEL
ALT: 28 U/L (ref 0–44)
AST: 35 U/L (ref 15–41)
Albumin: 2.4 g/dL — ABNORMAL LOW (ref 3.5–5.0)
Alkaline Phosphatase: 39 U/L (ref 38–126)
Anion gap: 16 — ABNORMAL HIGH (ref 5–15)
BUN: 115 mg/dL — ABNORMAL HIGH (ref 8–23)
CO2: 17 mmol/L — ABNORMAL LOW (ref 22–32)
Calcium: 7.5 mg/dL — ABNORMAL LOW (ref 8.9–10.3)
Chloride: 101 mmol/L (ref 98–111)
Creatinine, Ser: 3.45 mg/dL — ABNORMAL HIGH (ref 0.61–1.24)
GFR calc Af Amer: 16 mL/min — ABNORMAL LOW (ref >60)
GFR calc non Af Amer: 14 mL/min — ABNORMAL LOW (ref >60)
Glucose, Bld: 370 mg/dL — ABNORMAL HIGH (ref 70–99)
Potassium: 3.4 mmol/L — ABNORMAL LOW (ref 3.5–5.1)
Sodium: 134 mmol/L — ABNORMAL LOW (ref 135–145)
Total Bilirubin: 0.8 mg/dL (ref 0.3–1.2)
Total Protein: 5.3 g/dL — ABNORMAL LOW (ref 6.5–8.1)

## 2019-08-24 LAB — C-REACTIVE PROTEIN: CRP: 14 mg/dL — ABNORMAL HIGH (ref <1.0)

## 2019-08-24 LAB — FERRITIN: Ferritin: 3866 ng/mL — ABNORMAL HIGH (ref 24–336)

## 2019-08-24 LAB — LACTIC ACID, PLASMA
Lactic Acid, Venous: 2.2 mmol/L (ref 0.5–1.9)
Lactic Acid, Venous: 3.6 mmol/L (ref 0.5–1.9)
Lactic Acid, Venous: 2.8 mmol/L (ref 0.5–1.9)

## 2019-08-24 LAB — PROCALCITONIN: Procalcitonin: 1.17 ng/mL

## 2019-08-24 LAB — D-DIMER, QUANTITATIVE: D-Dimer, Quant: 0.91 ug/mL-FEU — ABNORMAL HIGH (ref 0.00–0.50)

## 2019-08-24 LAB — TROPONIN I (HIGH SENSITIVITY)
Troponin I (High Sensitivity): 143 ng/L (ref <18)
Troponin I (High Sensitivity): 113 ng/L (ref <18)

## 2019-08-24 MED ORDER — ALBUMIN HUMAN 25 % IV SOLN
12.5000 g | Freq: Once | INTRAVENOUS | Status: AC
  Administered 2019-08-24: 12.5 g via INTRAVENOUS
  Filled 2019-08-24: qty 50

## 2019-08-24 MED ORDER — RAMELTEON 8 MG PO TABS
8.0000 mg | ORAL_TABLET | Freq: Every evening | ORAL | Status: DC | PRN
  Administered 2019-08-24 – 2019-08-29 (×7): 8 mg via ORAL
  Filled 2019-08-24 (×8): qty 1

## 2019-08-24 MED ORDER — SODIUM CHLORIDE 0.9 % IV SOLN: INTRAVENOUS | Status: DC

## 2019-08-24 MED ORDER — DEXTROSE 50 % IV SOLN: 0.0000 mL | INTRAVENOUS | Status: DC | PRN

## 2019-08-24 MED ORDER — INSULIN GLARGINE 100 UNIT/ML ~~LOC~~ SOLN
10.0000 [IU] | Freq: Every day | SUBCUTANEOUS | Status: DC
  Filled 2019-08-24: qty 0.1

## 2019-08-24 MED ORDER — FUROSEMIDE 10 MG/ML IJ SOLN
40.0000 mg | Freq: Once | INTRAMUSCULAR | Status: AC
  Administered 2019-08-24: 40 mg via INTRAVENOUS

## 2019-08-24 MED ORDER — INSULIN ASPART 100 UNIT/ML ~~LOC~~ SOLN: 0.0000 [IU] | Freq: Three times a day (TID) | SUBCUTANEOUS | Status: DC

## 2019-08-24 MED ORDER — INSULIN REGULAR(HUMAN) IN NACL 100-0.9 UT/100ML-% IV SOLN
INTRAVENOUS | Status: DC
  Administered 2019-08-24: 9 [IU]/h via INTRAVENOUS

## 2019-08-24 MED ORDER — CHLORHEXIDINE GLUCONATE CLOTH 2 % EX PADS
6.0000 | MEDICATED_PAD | Freq: Every day | CUTANEOUS | Status: DC
  Administered 2019-08-25 – 2019-09-01 (×8): 6 via TOPICAL

## 2019-08-24 MED ORDER — INSULIN ASPART 100 UNIT/ML ~~LOC~~ SOLN: 0.0000 [IU] | Freq: Every day | SUBCUTANEOUS | Status: DC

## 2019-08-24 NOTE — Progress Notes (Signed)
Pt continues to be figidty and anxious, attempts to follow commands for a short time period, then becomes increasingly irritable, pulling leads, p ox and such off. Spoke to Dr Myna Hidalgo, obtained an EKG, the pharmacist was consulted by MD, and order for Rozerem obtained. Given at 0445, to patient, whom Is anxious and "worried" of sorts way. Will monitor effectiveness and document.

## 2019-08-24 NOTE — Progress Notes (Signed)
Rapid Response Event Note  Overview:   RN called to room for patient reporting shortness of breath & struggling to breathe.  Initial Focused Assessment: Pt. In bed, low 90%s oxygen saturation on 5lpm [place], not in notable distress ( no accessory muscle use), HR 50-60s afib with BBB, stable BP and Alert and pleasant.   Dry cough with fine crackles at bases bilaterally, reporting relief with HOB >45.   Interventions: MD paged, see new orders (lasix IV 40mg , foley catheter)  Plan of Care (if not transferred): Stop continuous IV fluids, continue ordered therapies  Event Summary: Pt. Alert, saturating >90% in bed, medications given and MD notified.  Pt. Instructed to use call light to notify nurse if symptoms persist or worsen.   Bridey Brookover G Billiejo Sorto

## 2019-08-24 NOTE — Progress Notes (Signed)
Pt continues to be pleasantly confused to place and time/year/president. VSS. Pt occasionally pulling off EKG leads and p.ox sat sensor. Pt presented at [place] for this RN with pulse ox in mid 80's, at that time on RA. Added oxygen via [place] (2-4L) with good results. Pt needs feeding/encouragment and push of P.O. intake, as diet is poor. External cath (primefit) changed at [place], currently patent and functioning. Pt has had multiple loose stools this evening. Turned and repositioned q2H. Skin issues mainly on bil arms, bruising, and skin tears, w/fragile skin and pts h/o falls. CBG's q AC/HS, with sliding scale coverage and Lantus added to pts MAR tonight. Will c/t monitor pts status and update as needed.

## 2019-08-24 NOTE — Progress Notes (Signed)
PROGRESS NOTE  SEITH AIKEY VPX:[id number] DOB: 27-Mar-1921 DOA: 08/21/2019 PCP: Haywood Pao, MD  HPI/Recap of past 24 hours: Patient is a 86 year old male with past medical history of atrial fibrillation, diabetes mellitus and hypertension with stage III chronic kidney disease recently seen after a fall and tested positive for Covid 2 days ago.  Since then patient has been weak and lethargic and eating very little.  Patient was brought to the emergency room on the evening of 2/11 with weakness.  Paramedics noted that he was in rapid A. fib and after fluid bolus had improved.  Temperature was 101.  In the emergency room, patient was found to be in severe sepsis secondary to pneumonia and Covid.  His CRP level elevated at 24.  He was given IV antibiotics, Remdisivir and fluids.  O2 was 95% on room air.  Other labs of note were a creatinine of 4.13 which peaked as high as 4.22 (baseline creatinine 3.0), white count of 11.5 and high-sensitivity troponin of 1500.  Patient was admitted to the hospitalist service and transferred to [facility].  An echocardiogram was ordered given elevated troponins and came back noting systolic/diastolic heart failure with an EF of 20%  (Confriemd from PCP this is new finding).  Since admission, patient's creatinine slowly improving.  Lactic acid level improved, but started to trend back upwards when fluid order expired.  Patient doing okay with no complaints and no shortness of breath.  Assessment/Plan: Principal Problem:   Severe sepsis ([place]) secondary to healthcare associated pneumonia: Patient meets criteria with tachycardia, tachypnea, fever, leukocytosis and lactic acidosis with pulmonary source as well as significant acute kidney injury.  Initial sepsis looks to be resolved with normalization of lactic acid level and I suspect that repeat elevations and lactic acid are more from patient being dry from higher blood sugars & needing more IV fluids.   we  will treat this as healthcare associated pneumonia given he is from a nursing facility and this is more than just Covid, given elevated procalcitonin level. Responding to abx as WBC ct & procalc decreasing.  Active Problems:  AF (atrial fibrillation) ([place]): Initially in rapid atrial fibrillation given sepsis but with fluids, has since become more rate controlled.  He is on p.o. Cardizem.  He is not on chronic anticoagulation  Dysphagia: Seen by speech therapy and put on a dysphagia 2 diet with thin liquids    DM (diabetes mellitus) uncontrolled with Stage IV CKD ([place]): A1C at 8.8. Increasing lantus coverage plus sliding scale.  Expect elevated CBGs given that he is on steroids for Covid.  Reported A1c as outpatient last year was 8.4.    Hypertension: Blood pressures have been stable despite sepsis.      Hyperlipemia: Continue statin.    Hypothyroidism: Continue Synthroid.    AKI (acute kidney injury) ([place]) in the setting of stage III/IV chronic kidney disease: This is likely from sepsis.  Spoke with her primary care doctor's partner on 2/13 and patient has baseline creatinine of 2.9 from Oct.  Given slow improvement in his renal function, suspect he is having third spacing so gave one dose of IV albumin on 2/13.    Hypokalemia: Replace potassium as needed.    COVID-19 virus infection: More of an incidental finding.  Might be a contributing factor in his sepsis.  Continue Remdisivir and steroids.  Continue to follow.    Demand ischemia [facility]): Suspect demand ischemia in the setting of sepsis, especially following treatment these  numbers have improved.  Also possible that we could have elevated troponins due to mild rapid atrial fibrillation and in [facility], but would not expect him to get better  New finding of chronic systolic/diastolic heart failure: He last had an echocardiogram done in 2010 which noted no evidence of systolic dysfunction.  Confirmed from his PCP that there has not been one done  since, so this would be a new diagnosis.  Unclear how long he has had heart failure.  Would favor conservative medical management rather than invasive cardiac catheterization when he is stable.  We will touch base with his primary care physician.  BNP mildly elevated, and this is more likely in the sense of his renal dysfunction.  However, with fluid resuscitation I suspect given his poor ejection fraction, we will likely become volume overloaded and will need to diurese him before he is discharged. He is on an ARB/HCTZ, currently on hold due to AKI  Code Status: DNR  Family Communication: Unable to leave voicemail for son.  Spoke with them on 2/12  Disposition Plan: Covid overall stable.  Home once renal function back to baseline and patient diuresed   Consultants:  None   Procedures:  Echocardiogram done 2/12: Decreased ejection fraction of 37% plus diastolic dysfunction  Antimicrobials:  IV Doxycycline 2/11-2/12  IV Zithromax 2/12-present  IV cefepime 2/12-present  DVT prophylaxis: SQ Heparin   Objective: Vitals:   08/24/19 0800 08/24/19 0854  BP:    Pulse: (!) 166 67  Resp: 17 19  Temp:    SpO2: 90% (!) 86%    Intake/Output Summary (Last 24 hours) at 08/24/2019 1325 Last data filed at 08/24/2019 1157 Gross per 24 hour  Intake 2123.18 ml  Output 650 ml  Net 1473.18 ml   Filed Weights   08/22/19 0638 08/24/19 0454  Weight: 65.4 kg 65.4 kg   Body mass index is 20.69 kg/m.  Exam:   General:  Alert & oriented x 3, no acute distress  HEENT: Normocephalic, Atraumatic, mucous membranes are dry  Neck: supple, no JVD  Cardiovascular: Irregular rhythm, borderline bradycardia (when sleeping)  Respiratory: Decreased breath sounds bibasilar   Abdomen: Soft, nontender, non-distended, +BS   Musculoskeletal: No clubbing, cyanosis.  Trade pitting edema   Skin: No skin breaks, tears or lesions  Psychiatry: Appropriate, no evidence of psychosis    Data  Reviewed: CBC: Recent Labs  Lab 08/21/19 1651 08/22/19 0045 08/22/19 1243 08/23/19 0207 08/24/19 0331  WBC 11.5* 14.6* 16.5* 11.6* 11.5*  NEUTROABS 10.5* 13.9*  --  9.1* 10.7*  HGB 14.6 14.6 15.7 13.3 13.3  HCT 43.6 42.8 47.3 39.5 39.6  MCV 91.2 91.3 92.0 90.2 91.0  PLT 175 182 200 202 628   Basic Metabolic Panel: Recent Labs  Lab 08/22/19 0045 08/22/19 1243 08/23/19 0207 08/23/19 1600 08/24/19 0331  NA 132* 135 134* 134* 134*  K 3.5 3.8 3.1* 3.2* 3.4*  CL 97* 97* 101 101 101  CO2 19* 20* 19* 18* 17*  GLUCOSE 197* 218* 223* 320* 370*  BUN 99* 99* 106* 95* 115*  CREATININE 4.00* 4.20* 3.59* 3.70* 3.45*  CALCIUM 7.7* 7.8* 7.4* 7.3* 7.5*   GFR: Estimated Creatinine Clearance: 11.6 mL/min (A) (by C-G formula based on SCr of 3.45 mg/dL (H)). Liver Function Tests: Recent Labs  Lab 08/21/19 1651 08/22/19 0045 08/23/19 0207 08/24/19 0331  AST 47* 49* 46* 35  ALT 27 25 30 28   ALKPHOS 30* 33* 33* 39  BILITOT 1.8* 1.3* 0.8 0.8  PROT  5.7* 5.6* 5.2* 5.3*  ALBUMIN 2.4* 2.4* 2.2* 2.4*   No results for input(s): LIPASE, AMYLASE in the last 168 hours. No results for input(s): AMMONIA in the last 168 hours. Coagulation Profile: No results for input(s): INR, PROTIME in the last 168 hours. Cardiac Enzymes: No results for input(s): CKTOTAL, CKMB, CKMBINDEX, TROPONINI in the last 168 hours. BNP (last 3 results) No results for input(s): PROBNP in the last 8760 hours. HbA1C: Recent Labs    08/23/19 1600  HGBA1C 8.7*   CBG: Recent Labs  Lab 08/23/19 1140 08/23/19 1638 08/23/19 2103 08/24/19 0839 08/24/19 1106  GLUCAP 338* 312* 282* 359* 355*   Lipid Profile: Recent Labs    08/21/19 1746  TRIG 290*   Thyroid Function Tests: No results for input(s): TSH, T4TOTAL, FREET4, T3FREE, THYROIDAB in the last 72 hours. Anemia Panel: Recent Labs    08/23/19 0207 08/24/19 0331  FERRITIN 3,498* 3,866*   Urine analysis:    Component Value Date/Time   COLORURINE  YELLOW 08/21/2019 2230   APPEARANCEUR HAZY (A) 08/21/2019 2230   LABSPEC 1.014 08/21/2019 2230   PHURINE 5.0 08/21/2019 2230   GLUCOSEU NEGATIVE 08/21/2019 2230   HGBUR SMALL (A) 08/21/2019 2230   BILIRUBINUR NEGATIVE 08/21/2019 2230   KETONESUR NEGATIVE 08/21/2019 2230   PROTEINUR 100 (A) 08/21/2019 2230   UROBILINOGEN 0.2 10/07/2010 1715   NITRITE NEGATIVE 08/21/2019 2230   LEUKOCYTESUR TRACE (A) 08/21/2019 2230   Sepsis Labs: @LABRCNTIP (procalcitonin:4,lacticidven:4)  ) Recent Results (from the past 240 hour(s))  Blood Culture (routine x 2)     Status: None (Preliminary result)   Collection Time: 08/21/19  4:45 PM   Specimen: BLOOD  Result Value Ref Range Status   Specimen Description BLOOD LEFT ANTECUBITAL  Final   Special Requests   Final    BOTTLES DRAWN AEROBIC AND ANAEROBIC Blood Culture adequate volume   Culture   Final    NO GROWTH 2 DAYS Performed at [facility], [place] [address] [place], [place] [address]    Report Status PENDING  Incomplete  Blood Culture (routine x 2)     Status: None (Preliminary result)   Collection Time: 08/21/19  6:00 PM   Specimen: BLOOD RIGHT ARM  Result Value Ref Range Status   Specimen Description BLOOD RIGHT ARM  Final   Special Requests   Final    BOTTLES DRAWN AEROBIC AND ANAEROBIC Blood Culture adequate volume   Culture   Final    NO GROWTH 2 DAYS Performed at [facility], [place] [address] [place], [place] [address]    Report Status PENDING  Incomplete      Studies: No results found.  Scheduled Meds: . aspirin EC  81 mg Oral Daily  . dexamethasone (DECADRON) injection  6 mg Intravenous Q24H  . diltiazem  360 mg Oral Daily  . docusate sodium  100 mg Oral Q12H  . feeding supplement (ENSURE ENLIVE)  237 mL Oral TID BM  . feeding supplement (PRO-STAT SUGAR FREE 64)  30 mL Oral BID  . fenofibrate  54 mg Oral Daily  . heparin  5,000 Units Subcutaneous Q8H  . insulin aspart  0-15 Units Subcutaneous TID WC  .  insulin aspart  0-5 Units Subcutaneous QHS  . insulin glargine  10 Units Subcutaneous QHS  . levothyroxine  75 mcg Oral Daily  . pantoprazole  40 mg Oral Daily  . pravastatin  10 mg Oral Daily  . tamsulosin  0.4 mg Oral QPC supper  Continuous Infusions: . sodium chloride 100 mL/hr at 08/24/19 1001  . azithromycin 500 mg (08/23/19 1304)  . ceFEPime (MAXIPIME) IV 1 g (08/24/19 1302)  . remdesivir 100 mg in NS 100 mL 100 mg (08/24/19 0834)     LOS: 3 days     Annita Brod, MD [facility]   08/24/2019, 1:25 PM

## 2019-08-24 NOTE — Progress Notes (Signed)
Pt eliciting on call bell, stating, "I cant breath." O2 support placed to 3 liters Nicolas Snyder from 1 liter and eventually to 5 liters with spo2 currently at 95%. Bilateral posterior lobes diminished with crackles, MIVF stopped. Dr. Maryland Pink notified. 16 fr foley catheter placed following bladder scan noting 38 ml. lasix 40 mg IV given, monitoring bun and creatinine. See vital flow sheet. Son Nicolas Snyder notified, spoke briefly with pt.

## 2019-08-24 NOTE — Progress Notes (Signed)
CRITICAL VALUE ALERT  Critical Value: Lactic acid 3.6  Date & Time Notied:  08/22/19 1734  Provider Notified: Page sent to Dr. Maryland Pink  Primary RN Notified: Birdie Riddle RN

## 2019-08-24 NOTE — Progress Notes (Signed)
More alert/Ox2 and active today, pulling on multiple lines and tubings. 1 liter [place] to room air with spo2 ranging 80's to mid 90. Bilateral breath sounds diminished with slight intermittent expiratory wheezing to left anterior chest . Pt is drinking well and tolerates his pureed meals fairly. NS maintained at 100 ml/hr. IV antibiotics and remdesivir given per order, afebrile, Afib with frequent pvc's, BP stable. IS and flutter valve used with encouragement. Son, Magda Paganini, left with a phone message.

## 2019-08-25 ENCOUNTER — Inpatient Hospital Stay (HOSPITAL_COMMUNITY): Payer: Medicare Other

## 2019-08-25 DIAGNOSIS — N179 Acute kidney failure, unspecified: Secondary | ICD-10-CM

## 2019-08-25 DIAGNOSIS — E1122 Type 2 diabetes mellitus with diabetic chronic kidney disease: Secondary | ICD-10-CM

## 2019-08-25 DIAGNOSIS — I5022 Chronic systolic (congestive) heart failure: Secondary | ICD-10-CM

## 2019-08-25 DIAGNOSIS — R6520 Severe sepsis without septic shock: Secondary | ICD-10-CM

## 2019-08-25 DIAGNOSIS — A419 Sepsis, unspecified organism: Secondary | ICD-10-CM

## 2019-08-25 DIAGNOSIS — J1282 Pneumonia due to coronavirus disease 2019: Secondary | ICD-10-CM

## 2019-08-25 LAB — CBC WITH DIFFERENTIAL/PLATELET
Abs Immature Granulocytes: 0.47 10*3/uL — ABNORMAL HIGH (ref 0.00–0.07)
Basophils Absolute: 0.1 10*3/uL (ref 0.0–0.1)
Basophils Relative: 1 %
Eosinophils Absolute: 0 10*3/uL (ref 0.0–0.5)
Eosinophils Relative: 0 %
HCT: 36.5 % — ABNORMAL LOW (ref 39.0–52.0)
Hemoglobin: 12.5 g/dL — ABNORMAL LOW (ref 13.0–17.0)
Immature Granulocytes: 4 %
Lymphocytes Relative: 3 %
Lymphs Abs: 0.3 10*3/uL — ABNORMAL LOW (ref 0.7–4.0)
MCH: 30.6 pg (ref 26.0–34.0)
MCHC: 34.2 g/dL (ref 30.0–36.0)
MCV: 89.2 fL (ref 80.0–100.0)
Monocytes Absolute: 0.5 10*3/uL (ref 0.1–1.0)
Monocytes Relative: 4 %
Neutro Abs: 10.9 10*3/uL — ABNORMAL HIGH (ref 1.7–7.7)
Neutrophils Relative %: 88 %
Platelets: 277 10*3/uL (ref 150–400)
RBC: 4.09 MIL/uL — ABNORMAL LOW (ref 4.22–5.81)
RDW: 13.2 % (ref 11.5–15.5)
WBC: 12.3 10*3/uL — ABNORMAL HIGH (ref 4.0–10.5)
nRBC: 0 % (ref 0.0–0.2)

## 2019-08-25 LAB — UPEP/UIFE/LIGHT CHAINS/TP, 24-HR UR
% BETA, Urine: 14.7 %
ALPHA 1 URINE: 1.9 %
Albumin, U: 56.6 %
Alpha 2, Urine: 9.8 %
Free Kappa Lt Chains,Ur: 219.15 mg/L — ABNORMAL HIGH (ref 0.63–113.79)
Free Kappa/Lambda Ratio: 1.89 (ref 1.03–31.76)
Free Lambda Lt Chains,Ur: 115.84 mg/L — ABNORMAL HIGH (ref 0.47–11.77)
GAMMA GLOBULIN URINE: 16.9 %
Total Protein, Urine: 88.1 mg/dL

## 2019-08-25 LAB — GLUCOSE, CAPILLARY
Glucose-Capillary: 107 mg/dL — ABNORMAL HIGH (ref 70–99)
Glucose-Capillary: 127 mg/dL — ABNORMAL HIGH (ref 70–99)
Glucose-Capillary: 133 mg/dL — ABNORMAL HIGH (ref 70–99)
Glucose-Capillary: 144 mg/dL — ABNORMAL HIGH (ref 70–99)
Glucose-Capillary: 148 mg/dL — ABNORMAL HIGH (ref 70–99)
Glucose-Capillary: 219 mg/dL — ABNORMAL HIGH (ref 70–99)
Glucose-Capillary: 267 mg/dL — ABNORMAL HIGH (ref 70–99)
Glucose-Capillary: 270 mg/dL — ABNORMAL HIGH (ref 70–99)
Glucose-Capillary: 258 mg/dL — ABNORMAL HIGH (ref 70–99)

## 2019-08-25 LAB — COMPREHENSIVE METABOLIC PANEL
ALT: 24 U/L (ref 0–44)
AST: 28 U/L (ref 15–41)
Albumin: 2.9 g/dL — ABNORMAL LOW (ref 3.5–5.0)
Alkaline Phosphatase: 34 U/L — ABNORMAL LOW (ref 38–126)
Anion gap: 15 (ref 5–15)
BUN: 129 mg/dL — ABNORMAL HIGH (ref 8–23)
CO2: 17 mmol/L — ABNORMAL LOW (ref 22–32)
Calcium: 8 mg/dL — ABNORMAL LOW (ref 8.9–10.3)
Chloride: 107 mmol/L (ref 98–111)
Creatinine, Ser: 3.57 mg/dL — ABNORMAL HIGH (ref 0.61–1.24)
GFR calc Af Amer: 16 mL/min — ABNORMAL LOW (ref >60)
GFR calc non Af Amer: 14 mL/min — ABNORMAL LOW (ref >60)
Glucose, Bld: 142 mg/dL — ABNORMAL HIGH (ref 70–99)
Potassium: 3.1 mmol/L — ABNORMAL LOW (ref 3.5–5.1)
Sodium: 139 mmol/L (ref 135–145)
Total Bilirubin: 0.9 mg/dL (ref 0.3–1.2)
Total Protein: 5.4 g/dL — ABNORMAL LOW (ref 6.5–8.1)

## 2019-08-25 LAB — LACTIC ACID, PLASMA: Lactic Acid, Venous: 2.1 mmol/L (ref 0.5–1.9)

## 2019-08-25 LAB — PROTEIN ELECTROPHORESIS, SERUM
A/G Ratio: 1 (ref 0.7–1.7)
Albumin ELP: 2.7 g/dL — ABNORMAL LOW (ref 2.9–4.4)
Alpha-1-Globulin: 0.3 g/dL (ref 0.0–0.4)
Alpha-2-Globulin: 0.9 g/dL (ref 0.4–1.0)
Beta Globulin: 0.9 g/dL (ref 0.7–1.3)
Gamma Globulin: 0.5 g/dL (ref 0.4–1.8)
Globulin, Total: 2.6 g/dL (ref 2.2–3.9)
Total Protein ELP: 5.3 g/dL — ABNORMAL LOW (ref 6.0–8.5)

## 2019-08-25 LAB — D-DIMER, QUANTITATIVE: D-Dimer, Quant: 0.9 ug/mL-FEU — ABNORMAL HIGH (ref 0.00–0.50)

## 2019-08-25 LAB — C-REACTIVE PROTEIN: CRP: 8 mg/dL — ABNORMAL HIGH (ref <1.0)

## 2019-08-25 LAB — FERRITIN: Ferritin: 2779 ng/mL — ABNORMAL HIGH (ref 24–336)

## 2019-08-25 MED ORDER — DEXTROSE-NACL 5-0.9 % IV SOLN
INTRAVENOUS | Status: DC
  Administered 2019-08-25: 75 mL/h via INTRAVENOUS

## 2019-08-25 MED ORDER — POTASSIUM CHLORIDE CRYS ER 20 MEQ PO TBCR
40.0000 meq | EXTENDED_RELEASE_TABLET | Freq: Once | ORAL | Status: AC
  Administered 2019-08-25: 09:00:00 40 meq via ORAL
  Filled 2019-08-25: qty 2

## 2019-08-25 MED ORDER — INSULIN ASPART 100 UNIT/ML ~~LOC~~ SOLN
0.0000 [IU] | Freq: Three times a day (TID) | SUBCUTANEOUS | Status: DC
  Administered 2019-08-25: 8 [IU] via SUBCUTANEOUS
  Administered 2019-08-25: 2 [IU] via SUBCUTANEOUS
  Administered 2019-08-25: 5 [IU] via SUBCUTANEOUS
  Administered 2019-08-25: 8 [IU] via SUBCUTANEOUS
  Administered 2019-08-26 – 2019-08-28 (×4): 2 [IU] via SUBCUTANEOUS
  Administered 2019-08-29 (×2): 3 [IU] via SUBCUTANEOUS
  Administered 2019-08-30: 8 [IU] via SUBCUTANEOUS
  Administered 2019-08-30 – 2019-08-31 (×3): 3 [IU] via SUBCUTANEOUS
  Administered 2019-09-01: 5 [IU] via SUBCUTANEOUS

## 2019-08-25 MED ORDER — INSULIN DETEMIR 100 UNIT/ML ~~LOC~~ SOLN
15.0000 [IU] | Freq: Two times a day (BID) | SUBCUTANEOUS | Status: DC
  Administered 2019-08-25 – 2019-08-26 (×3): 15 [IU] via SUBCUTANEOUS
  Filled 2019-08-25 (×4): qty 0.15

## 2019-08-25 MED ORDER — INSULIN GLARGINE 100 UNIT/ML ~~LOC~~ SOLN
8.0000 [IU] | SUBCUTANEOUS | Status: DC
  Administered 2019-08-25: 8 [IU] via SUBCUTANEOUS
  Filled 2019-08-25: qty 0.08

## 2019-08-25 MED ORDER — INSULIN ASPART 100 UNIT/ML ~~LOC~~ SOLN
0.0000 [IU] | Freq: Every day | SUBCUTANEOUS | Status: DC
  Administered 2019-08-25: 3 [IU] via SUBCUTANEOUS
  Administered 2019-08-29: 2 [IU] via SUBCUTANEOUS
  Administered 2019-08-30: 3 [IU] via SUBCUTANEOUS

## 2019-08-25 NOTE — Progress Notes (Signed)
Pt pleasantly confused, follows commands and is compliant for short periods of time. O2 weaned off, at RA low 90's sats. Continues to pull off leads at times. Endotool d/cd this am at 0430, with sliding scale coverage of CBG at that time, will resume AC/HS along w/lantus dosing. Foley w/marginal output. Stools continue loose, less frequent. VSS, afebrile. Pt needs to be encouraged to eat and be fed, or will not eat himself. Will continue to monitor and update as needed.

## 2019-08-25 NOTE — Progress Notes (Signed)
Patient is A/O to self  Foley in place & foley care done-pt was bathed Pt sat in chair for majority of the day-all meals in chair. Pt appetite is poor but he atleast had a few bites of every meal & part of his Ensure. Pt became restless & pulled ouRhand IV early this afternoon & I sat in the room with him for a few hours to redirect him. **x2 assist back to bed   Foam dressing to L elbow saturated & changed

## 2019-08-25 NOTE — Progress Notes (Signed)
Inpatient Diabetes Program Recommendations  AACE/ADA: New Consensus Statement on Inpatient Glycemic Control (2015)  Target Ranges:  Prepandial:   less than 140 mg/dL      Peak postprandial:   less than 180 mg/dL (1-2 hours)      Critically ill patients:  140 - 180 mg/dL   Lab Results  Component Value Date   GLUCAP 219 (H) 08/25/2019   HGBA1C 8.7 (H) 08/23/2019    Review of Glycemic Control  Diabetes history: DM 2 Outpatient Diabetes medications: Glipizide 10 mg Daily Current orders for Inpatient glycemic control: Transitioning from IV insulin to  Lantus 8 units Novolog 0-15 units tid + hs  Decadron 6 mg Q24 hours BUN/Creat: 129/3.57 Ensure Enlive tid between meals  Inpatient Diabetes Program Recommendations:    -Consider increasing Lantus to 10 units.  -Consider adding Novolog 2 units tid meal coverage if eating at least 50% of meals.  Thanks,  Tama Headings RN, MSN, BC-ADM Inpatient Diabetes Coordinator Team Pager [phone] (8a-5p)

## 2019-08-25 NOTE — Progress Notes (Signed)
Pharmacy Antibiotic Note  Nicolas Snyder is a 86 y.o. male admitted on 08/21/2019 with sepsis, HCAP, found to have COVID-19 infection.  Pharmacy has been consulted for Cefepime dosing.   PCN allergy noted (Eye swelling):  Son, Nicolas Snyder unable to provide more information.  [facility] pharmacy doesn't have any reactions listed for PCN allergy.  [facility] facility doesn't have any allergies listed.  Day 4 of cefepime with no reactions noted.   Plan: Cefepime 1g IV q24h Azithromycin 500mg  IV q24h. F/u LOT with MD F/u renal function.   Height: 5\' 10"  (177.8 cm) Weight: 144 lb 2.9 oz (65.4 kg) IBW/kg (Calculated) : 73  Temp (24hrs), Avg:97.5 F (36.4 C), Min:97 F (36.1 C), Max:97.9 F (36.6 C)  Recent Labs  Lab 08/22/19 0045 08/22/19 1015 08/22/19 1243 08/22/19 1427 08/23/19 0207 08/23/19 1600 08/24/19 0331 08/24/19 1515 08/24/19 1837 08/25/19 0454  WBC 14.6*  --  16.5*  --  11.6*  --  11.5*  --   --  12.3*  CREATININE 4.00*  --  4.20*  --  3.59* 3.70* 3.45*  --   --  3.57*  LATICACIDVEN  --    < >  --    < > 1.6  --  2.2* 3.6* 2.8* 2.1*   < > = values in this interval not displayed.    Estimated Creatinine Clearance: 11.2 mL/min (A) (by C-G formula based on SCr of 3.57 mg/dL (H)).    Allergies  Allergen Reactions  . Penicillins Hives, Swelling and Other (See Comments)    Eye swelling Did it involve swelling of the face/tongue/throat, SOB, or low BP? Unknown Did it involve sudden or severe rash/hives, skin peeling, or any reaction on the inside of your mouth or nose? Unknown Did you need to seek medical attention at a hospital or doctor's office? Unknown When did it last happen?"Many years ago" If all above answers are "NO", may proceed with cephalosporin use.     Antimicrobials this admission: 2/11 Remdesivir >> (2/15) 2/11 Doxy >> 2/12 2/12 Cefepime >>  2/12 Azithromycin >>   Microbiology results: 2/11 BCx:  ngtd 2/11 Strep urine antigen negative 2/11  Legionella Urine antitgen Pending   Nicolas Snyder A. Levada Dy, PharmD, BCPS, FNKF Clinical Pharmacist [place] Please utilize Amion for appropriate phone number to reach the unit pharmacist ([place])   08/25/2019 9:10 AM

## 2019-08-25 NOTE — Progress Notes (Addendum)
PROGRESS NOTE    Nicolas Snyder  MCN:[id number] DOB: [birth year] DOA: 08/21/2019 PCP: Haywood Pao, MD    Brief Narrative:  Patient was admitted to the hospital with a working diagnosis of acute hypoxic respiratory failure due to SARS COVID-19 viral pneumonia complicated by acute on chronic kidney injury/to rule out bacterial sepsis.  86 year old male who presented with fever.  He does have significant past medical history for dyslipidemia, hypertension, type II times mellitus, paroxysmal atrial fibrillation, hypothyroidism and chronic kidney disease stage III.  He lives at a facility "[facility]", reported to have poor appetite, poor oral intake and dehydration.  He was seen week and lethargic.  On his initial physical examination his temperature was 101.8 F, heart rate 103, respiratory 27, blood pressure 142/99, oxygen saturation 95% on room air.  His lungs had bibasilar rales, no wheezing, heart S1-S2, present rhythmic, soft abdomen, no lower extremity edema.  Sodium 133, potassium 3.1, chloride 96, bicarb 22, glucose 258, BUN 93, creatinine 4.13, white count 11.5, hemoglobin 14.6, hematocrit 43.6, platelets 175.  SARS COVID-19 was positive.  Urine analysis 11-20 white cells, specific gravity 1.0 14, protein 100.  Chest radiograph had a patchy infiltrate at the base of the right lower lobe, faint bibasilar interstitial infiltrates.  His EKG had 110 bpm, right axis deviation, right bundle branch block, atrial fibrillation rhythm, inferior Q waves, positive PVC, no ST segment or significant T wave changes.  Patient has received medical therapy with remdesivir, steroids and antibiotics.  Further work-up with echocardiogram revealed ejection fraction of 20%  Developed pulmonary edema, that improved with diuresis.   Assessment & Plan:   Principal Problem:   Pneumonia due to COVID-19 virus Active Problems:   AF (atrial fibrillation) (HCC)   DM (diabetes mellitus) ([place])   Hypertension  Hyperlipemia   Hypothyroidism   AKI (acute kidney injury) ([place])   Hypokalemia   COVID-19 virus infection   Severe sepsis (HCC)   Chronic systolic (congestive) heart failure ([place])   CKD stage 4 due to type 2 diabetes mellitus ([place])   1.  Acute hypoxic respiratory failure due to SARS COVID-19 viral pneumonia complicated with sepsis due to bacterial pneumonia coinfection,(end-organ failure, acute kidney injury and lactic acidosis). Sp Remdesivir #5/5.   RR: 21  Pulse oxymetry: 93%  Fi02: 21% room air.   COVID-19 Labs  Recent Labs    08/23/19 0207 08/24/19 0331 08/25/19 0454  DDIMER 1.26* 0.91* 0.90*  FERRITIN 3,498* 3,866* 2,779*  CRP 19.0* 14.0* 8.0*    No results found for: SARSCOV2NAA  Inflammatory markers are trending down along with oxygen requirements. Will order follow up chest radiograph today. Clinically more euvolemic. Cultures have been no growth.   Will continue medical therapy with dexamethasone, antitussive agents, bronchodilators and airway clearing techniques with flutter valve and incentive spirometer.   Out of bed to chair tid with meals, physical and occupational therapy evaluation. Consult nutrition.   Will dc azithromycin and will complete cefepime 5 doses of cefepime. Continue aspiration precautions, dysphagia 2 diet.   2. Paroxysmal atrial fibrillation. Continue rate control with diltiazem, continue telemetry monitoring. Patient has been deemed not candidate for anticoagulation.    3. Uncontrolled T2DM ( Hgb A1c 8.4), with steroid induced hyperglycemia. Dyslipidemia. Fasting glucose is 142, patient used about 2 units per H per insulin drip. Will resume basal basal insulin 15 units bid, along with insulin sliding scale for glucose cover and monitoring.  Continue with statin therapy.   4. CKD stage IV with hypokalemia,  anion gap metabolic acidosis. Renal function with serum cr at 3,57, with K at 3,1 and serum bicarbonate at 17. Worsening BUN at 129. Will  give 79 Kcl now and follow renal panel in am, avoid further diuresis for now.   5. HTN. Stable blood pressure 128/76, continue close monitoring.   6. Acute on chronic systolic heart failure. Echocardiogram with LV systolic function 20 to 77%, preserved RV systolic function. Will continue supportive medical therapy with carvedilol, hold on ace inh due to risk or worsening renal function.   7. Hypothyroid. Continue with levithyroxine  8. Metabolic encephalopathy. Patient continue to be confused and disorientated. Will continue neuro checks per unit protocol, aspiration precautions, and nutritional support.   9. Unspecified calorie protein malnutrition. Will continue with nutritional supplementation.    DVT prophylaxis: enoxaparin   Code Status:  DNR  Family Communication: no family at the bedside  Disposition Plan/ discharge barriers: patient continue acutely ill, will likely need SNF.    Nutrition Status: Nutrition Problem: Increased nutrient needs Etiology: acute illness, catabolic illness(COVID) Signs/Symptoms: estimated needs Interventions: Ensure Enlive (each supplement provides 350kcal and 20 grams of protein), Magic cup, Glucerna shake, MVI     Subjective: Patient with mild confusion, no apparent dyspnea or chest pain, noted to be very weak and deconditioned. No nausea or vomiting.   Objective: Vitals:   08/25/19 0400 08/25/19 0547 08/25/19 0700 08/25/19 0702  BP: (!) 105/59 124/61 128/67 128/76  Pulse: 67 65 60   Resp: (!) 21  (!) 21   Temp: (!) 97 F (36.1 C)   (!) 97.2 F (36.2 C)  TempSrc: Oral   Axillary  SpO2: 94%  93%   Weight:      Height:        Intake/Output Summary (Last 24 hours) at 08/25/2019 0839 Last data filed at 08/25/2019 0500 Gross per 24 hour  Intake 1583.63 ml  Output 676 ml  Net 907.63 ml   Filed Weights   08/22/19 0638 08/24/19 0454  Weight: 65.4 kg 65.4 kg    Examination:   General: deconditioned  Neurology: Awake and alert, non  focal  E ENT: mild pallor, no icterus, oral mucosa moist Cardiovascular: No JVD. S1-S2 present, rhythmic, no gallops, rubs, or murmurs. No lower extremity edema. Pulmonary: positive breath sounds bilaterally. Gastrointestinal. Abdomen with no organomegaly, non tender, no rebound or guarding Skin. No rashes Musculoskeletal: no joint deformities     Data Reviewed: I have personally reviewed following labs and imaging studies  CBC: Recent Labs  Lab 08/21/19 1651 08/21/19 1651 08/22/19 0045 08/22/19 1243 08/23/19 0207 08/24/19 0331 08/25/19 0454  WBC 11.5*   < > 14.6* 16.5* 11.6* 11.5* 12.3*  NEUTROABS 10.5*  --  13.9*  --  9.1* 10.7* 10.9*  HGB 14.6   < > 14.6 15.7 13.3 13.3 12.5*  HCT 43.6   < > 42.8 47.3 39.5 39.6 36.5*  MCV 91.2   < > 91.3 92.0 90.2 91.0 89.2  PLT 175   < > 182 200 202 240 277   < > = values in this interval not displayed.   Basic Metabolic Panel: Recent Labs  Lab 08/22/19 1243 08/23/19 0207 08/23/19 1600 08/24/19 0331 08/25/19 0454  NA 135 134* 134* 134* 139  K 3.8 3.1* 3.2* 3.4* 3.1*  CL 97* 101 101 101 107  CO2 20* 19* 18* 17* 17*  GLUCOSE 218* 223* 320* 370* 142*  BUN 99* 106* 95* 115* 129*  CREATININE 4.20* 3.59* 3.70* 3.45* 3.57*  CALCIUM 7.8* 7.4* 7.3* 7.5* 8.0*   GFR: Estimated Creatinine Clearance: 11.2 mL/min (A) (by C-G formula based on SCr of 3.57 mg/dL (H)). Liver Function Tests: Recent Labs  Lab 08/21/19 1651 08/22/19 0045 08/23/19 0207 08/24/19 0331 08/25/19 0454  AST 47* 49* 46* 35 28  ALT 27 25 30 28 24   ALKPHOS 30* 33* 33* 39 34*  BILITOT 1.8* 1.3* 0.8 0.8 0.9  PROT 5.7* 5.6* 5.2* 5.3* 5.4*  ALBUMIN 2.4* 2.4* 2.2* 2.4* 2.9*   No results for input(s): LIPASE, AMYLASE in the last 168 hours. No results for input(s): AMMONIA in the last 168 hours. Coagulation Profile: No results for input(s): INR, PROTIME in the last 168 hours. Cardiac Enzymes: No results for input(s): CKTOTAL, CKMB, CKMBINDEX, TROPONINI in the last  168 hours. BNP (last 3 results) No results for input(s): PROBNP in the last 8760 hours. HbA1C: Recent Labs    08/23/19 1600  HGBA1C 8.7*   CBG: Recent Labs  Lab 08/25/19 0117 08/25/19 0212 08/25/19 0327 08/25/19 0434 08/25/19 0808  GLUCAP 107* 127* 144* 148* 219*   Lipid Profile: No results for input(s): CHOL, HDL, LDLCALC, TRIG, CHOLHDL, LDLDIRECT in the last 72 hours. Thyroid Function Tests: No results for input(s): TSH, T4TOTAL, FREET4, T3FREE, THYROIDAB in the last 72 hours. Anemia Panel: Recent Labs    08/23/19 0207 08/24/19 0331  FERRITIN 3,498* 3,866*      Radiology Studies: I have reviewed all of the imaging during this hospital visit personally     Scheduled Meds: . aspirin EC  81 mg Oral Daily  . Chlorhexidine Gluconate Cloth  6 each Topical Daily  . dexamethasone (DECADRON) injection  6 mg Intravenous Q24H  . diltiazem  360 mg Oral Daily  . docusate sodium  100 mg Oral Q12H  . feeding supplement (ENSURE ENLIVE)  237 mL Oral TID BM  . feeding supplement (PRO-STAT SUGAR FREE 64)  30 mL Oral BID  . fenofibrate  54 mg Oral Daily  . heparin  5,000 Units Subcutaneous Q8H  . insulin aspart  0-15 Units Subcutaneous TID WC  . insulin aspart  0-5 Units Subcutaneous QHS  . insulin glargine  8 Units Subcutaneous Q24H  . levothyroxine  75 mcg Oral Daily  . pantoprazole  40 mg Oral Daily  . pravastatin  10 mg Oral Daily  . tamsulosin  0.4 mg Oral QPC supper   Continuous Infusions: . azithromycin 500 mg (08/24/19 1428)  . ceFEPime (MAXIPIME) IV 1 g (08/24/19 1302)  . dextrose 5 % and 0.9% NaCl 75 mL/hr (08/25/19 0147)  . insulin Stopped (08/25/19 0438)  . remdesivir 100 mg in NS 100 mL 100 mg (08/24/19 0834)     LOS: 4 days        Vernica Wachtel Gerome Apley, MD

## 2019-08-26 DIAGNOSIS — E1122 Type 2 diabetes mellitus with diabetic chronic kidney disease: Secondary | ICD-10-CM

## 2019-08-26 DIAGNOSIS — E785 Hyperlipidemia, unspecified: Secondary | ICD-10-CM

## 2019-08-26 DIAGNOSIS — N184 Chronic kidney disease, stage 4 (severe): Secondary | ICD-10-CM

## 2019-08-26 LAB — COMPREHENSIVE METABOLIC PANEL
ALT: 26 U/L (ref 0–44)
AST: 26 U/L (ref 15–41)
Albumin: 2.8 g/dL — ABNORMAL LOW (ref 3.5–5.0)
Alkaline Phosphatase: 34 U/L — ABNORMAL LOW (ref 38–126)
Anion gap: 16 — ABNORMAL HIGH (ref 5–15)
BUN: 147 mg/dL — ABNORMAL HIGH (ref 8–23)
CO2: 16 mmol/L — ABNORMAL LOW (ref 22–32)
Calcium: 8.1 mg/dL — ABNORMAL LOW (ref 8.9–10.3)
Chloride: 105 mmol/L (ref 98–111)
Creatinine, Ser: 4.01 mg/dL — ABNORMAL HIGH (ref 0.61–1.24)
GFR calc Af Amer: 14 mL/min — ABNORMAL LOW (ref >60)
GFR calc non Af Amer: 12 mL/min — ABNORMAL LOW (ref >60)
Glucose, Bld: 115 mg/dL — ABNORMAL HIGH (ref 70–99)
Potassium: 3.2 mmol/L — ABNORMAL LOW (ref 3.5–5.1)
Sodium: 137 mmol/L (ref 135–145)
Total Bilirubin: 0.7 mg/dL (ref 0.3–1.2)
Total Protein: 5.6 g/dL — ABNORMAL LOW (ref 6.5–8.1)

## 2019-08-26 LAB — CBC WITH DIFFERENTIAL/PLATELET
Abs Immature Granulocytes: 0.65 10*3/uL — ABNORMAL HIGH (ref 0.00–0.07)
Basophils Absolute: 0 10*3/uL (ref 0.0–0.1)
Basophils Relative: 0 %
Eosinophils Absolute: 0 10*3/uL (ref 0.0–0.5)
Eosinophils Relative: 0 %
HCT: 37.9 % — ABNORMAL LOW (ref 39.0–52.0)
Hemoglobin: 13 g/dL (ref 13.0–17.0)
Immature Granulocytes: 5 %
Lymphocytes Relative: 4 %
Lymphs Abs: 0.5 10*3/uL — ABNORMAL LOW (ref 0.7–4.0)
MCH: 30.4 pg (ref 26.0–34.0)
MCHC: 34.3 g/dL (ref 30.0–36.0)
MCV: 88.8 fL (ref 80.0–100.0)
Monocytes Absolute: 0.6 10*3/uL (ref 0.1–1.0)
Monocytes Relative: 4 %
Neutro Abs: 11.5 10*3/uL — ABNORMAL HIGH (ref 1.7–7.7)
Neutrophils Relative %: 87 %
Platelets: 289 10*3/uL (ref 150–400)
RBC: 4.27 MIL/uL (ref 4.22–5.81)
RDW: 13.5 % (ref 11.5–15.5)
WBC: 13.2 10*3/uL — ABNORMAL HIGH (ref 4.0–10.5)
nRBC: 0 % (ref 0.0–0.2)

## 2019-08-26 LAB — C-REACTIVE PROTEIN: CRP: 5.6 mg/dL — ABNORMAL HIGH (ref <1.0)

## 2019-08-26 LAB — LEGIONELLA PNEUMOPHILA SEROGP 1 UR AG: L. pneumophila Serogp 1 Ur Ag: NEGATIVE

## 2019-08-26 LAB — CULTURE, BLOOD (ROUTINE X 2)
Culture: NO GROWTH
Culture: NO GROWTH
Special Requests: ADEQUATE
Special Requests: ADEQUATE

## 2019-08-26 LAB — D-DIMER, QUANTITATIVE: D-Dimer, Quant: 1.28 ug/mL-FEU — ABNORMAL HIGH (ref 0.00–0.50)

## 2019-08-26 LAB — GLUCOSE, CAPILLARY
Glucose-Capillary: 93 mg/dL (ref 70–99)
Glucose-Capillary: 119 mg/dL — ABNORMAL HIGH (ref 70–99)
Glucose-Capillary: 135 mg/dL — ABNORMAL HIGH (ref 70–99)
Glucose-Capillary: 126 mg/dL — ABNORMAL HIGH (ref 70–99)

## 2019-08-26 LAB — FERRITIN: Ferritin: 2479 ng/mL — ABNORMAL HIGH (ref 24–336)

## 2019-08-26 MED ORDER — STERILE WATER FOR INJECTION IV SOLN
INTRAVENOUS | Status: DC
  Filled 2019-08-26: qty 850

## 2019-08-26 MED ORDER — POTASSIUM CHLORIDE CRYS ER 20 MEQ PO TBCR
40.0000 meq | EXTENDED_RELEASE_TABLET | Freq: Once | ORAL | Status: AC
  Administered 2019-08-26: 40 meq via ORAL
  Filled 2019-08-26: qty 2

## 2019-08-26 NOTE — Progress Notes (Signed)
Occupational therapy Evaluation  Clinical Impression: Patient reports residing in a facility, but was not able to recall name. Patient's cognitive deficits presents as a barrier for prior history information. Patient was not able to provide an accurate statement on the amount of assistance required with ADL tasks and mobility at PLOF. Patient did report mobilizing with walker in home. Patient presents with increased level of fatigue, dizziness when change in position (BP SUPINE 135/72, sitting EOB 141/59), increase in level of assist with functional mobility and self-care tasks. Patient was not able to complete a sit to stand transfer due to increase in dizziness. Patient was able to complete grooming task with Moderate A and cues to initiate each task. O2 stats remained in the 90s throughout OT evaluation.  Patient will benefit from continued skilled OT services in acute setting.     08/26/19 1400  OT Visit Information  Assistance Needed +2  History of Present Illness Patient was admitted to the hospital with a working diagnosis of acute hypoxic respiratory failure due to SARS COVID-19 viral pneumonia complicated by acute on chronic kidney injury/to rule out bacterial sepsis. 86 y/o pt w/ past medical history for dyslipidemia, hypertension, type II times mellitus, paroxysmal atrial fibrillation, hypothyroidism and chronic kidney disease stage III.  He lives at a facility "[facility]".  Precautions  Precautions Fall;Other (comment)  Restrictions  Weight Bearing Restrictions No  Home Living  Family/patient expects to be discharged to: Assisted living  [facility] - 2 wheels  Prior Function  Level of [place] Needs assistance  Gait / Transfers Assistance Needed ambulated with walker as per own reporting  ADL's / [place] assisted by staff at  facility  Communication  Communication HOH  Pain Assessment  Pain Assessment No/denies pain  Cognition   Arousal/Alertness Awake/alert  Behavior During Therapy Impulsive  Overall Cognitive Status No family/caregiver present to determine baseline cognitive functioning  Upper Extremity Assessment  Upper Extremity Assessment Generalized weakness;RUE deficits/detail;LUE deficits/detail  RUE  (Grossly3+/5, AROM WFL )  LUE  (Grossly3+/5, AROM WFL )  ADL  Overall ADL's  Needs assistance/impaired  Eating/Feeding Set up  Grooming Wash/dry face;Oral care;Moderate assistance;Sitting  Upper Body Bathing Maximal assistance  Lower Body Bathing Total assistance  Upper Body Dressing  Maximal assistance  Lower Body Dressing Total assistance  Toilet Transfer Total assistance  Toileting- Clothing Manipulation and Hygiene Total assistance  Functional mobility during ADLs +2 for safety/equipment;Maximal assistance  Vision- History  Baseline Vision/History Wears glasses  Wears Glasses At all times  Bed Mobility  Overal bed mobility Needs Assistance  Bed Mobility Supine to Sit;Sit to Supine  Supine to sit Max assist;Mod assist  Sit to supine Mod assist  General bed mobility comments needs assist with BLE movement   Transfers  Overall transfer level Needs assistance  General transfer comment Patient was too dizzy to iniitiate sit to stand transfer, requested to return to supine   Balance  Overall balance assessment Needs assistance  Sitting balance-Leahy Scale Fair  OT - End of Session  Activity Tolerance Patient limited by fatigue  OT Assessment  OT Recommendation/Assessment Patient needs continued OT Services  OT Visit Diagnosis Unsteadiness on feet (R26.81);Muscle weakness (generalized) (M62.81);History of falling (Z91.81)  OT Problem List Decreased strength;Decreased activity tolerance;Impaired balance (sitting and/or standing);Decreased coordination;Decreased safety awareness;Decreased knowledge of precautions;Decreased knowledge of use of DME or AE;Cardiopulmonary status limiting activity  OT  Plan  OT Frequency (ACUTE ONLY) Min 2X/week  OT Treatment/Interventions (ACUTE ONLY) Self-care/ADL training;Therapeutic exercise;Energy conservation;DME and/or  AE instruction;Therapeutic activities;Patient/family education;Balance training  AM-PAC OT "6 Clicks" Daily Activity Outcome Measure (Version 2)  Help from another person eating meals? 3  Help from another person taking care of personal grooming? 2  Help from another person toileting, which includes using toliet, bedpan, or urinal? 1  Help from another person bathing (including washing, rinsing, drying)? 2  Help from another person to put on and taking off regular upper body clothing? 2  Help from another person to put on and taking off regular lower body clothing? 1  6 Click Score 11  OT Recommendation  Follow Up Recommendations SNF  OT Equipment 3 in 1 bedside commode  Acute Rehab OT Goals  Patient Stated Goal did not voice any goals  Time For Goal Achievement 09/09/19  Potential to Achieve Goals Fair  OT Time Calculation  OT Start Time (ACUTE ONLY) 1425  OT Stop Time (ACUTE ONLY) 1501  OT Time Calculation (min) 36 min  OT General Charges  $OT Visit 1 Visit  OT Evaluation  $OT Eval Moderate Complexity 1 Mod  OT Treatments  $Self Care/Home Management  8-22 mins  Written Expression  Dominant Hand  (Amidexterous)   Saylor Sheckler OTR/L

## 2019-08-26 NOTE — Progress Notes (Signed)
  Speech Language Pathology Treatment: Dysphagia  Patient Details Name: Nicolas Snyder MRN: [id number] DOB: Nov 05, 1920 Today's Date: 08/26/2019 Time: 1324-4010 SLP Time Calculation (min) (ACUTE ONLY): 22 min  Assessment / Plan / Recommendation Clinical Impression  Pt presentation this am consistent with RN report in chart. Pt alert, taking plenty of thin liquids via straw without signs of aspiration. Pt reports minimal appetite, only accepted a few bites of eggs and grits. Prolonged mastication with diffuse spread of bolus throughout oral caivty requiring a liquid wash. Current texture (dys 2) remains appropriate. Pt tolerating diet texture and expected to require soft foods into the future. No further SLP interventions needed at this time.   HPI HPI: Nicolas Snyder  is a 86 y.o. male,  w gerd, hypertension, GERD, hyperlipidemia, Dm2, CKD stage 3, Pafib, Hypothyroidism, DNR, covid-19 positive, presents with c/o poor appetite and concern for dehydration at [facility]. Positive Covid test 2/10. CXR Indistinct opacity throughout the right mid and lower lung      SLP Plan  All goals met, SLP will sign off       Recommendations  Diet recommendations: Dysphagia 2 (fine chop);Thin liquid Liquids provided via: Cup;Straw Medication Administration: Whole meds with puree Supervision: Staff to assist with self feeding Compensations: Slow rate;Small sips/bites Postural Changes and/or Swallow Maneuvers: Seated upright 90 degrees                Follow up Recommendations: 24 hour supervision/assistance SLP Visit Diagnosis: Dysphagia, oral phase (R13.11) Plan: sign off       GO               .bdsign  Nicolas Snyder, Katherene Ponto 08/26/2019, 8:41 AM

## 2019-08-26 NOTE — Progress Notes (Signed)
Pt friend Nicolas Snyder updated over the phone. All questions & concerns addressed.  Pt bathed today & changed linen/gown as well as peri/foley care.  ** Fed part of each meal until he was full VSS/pt is on RA Confused @ baseline

## 2019-08-26 NOTE — Evaluation (Signed)
Physical Therapy Evaluation Patient Details Name: Nicolas Snyder MRN: [id number] DOB: December 14, 1920 Today's Date: 08/26/2019   History of Present Illness  Patient was admitted to the hospital with a working diagnosis of acute hypoxic respiratory failure due to SARS COVID-19 viral pneumonia complicated by acute on chronic kidney injury/to rule out bacterial sepsis. 86 y/o pt w/ past medical history for dyslipidemia, hypertension, type II times mellitus, paroxysmal atrial fibrillation, hypothyroidism and chronic kidney disease stage III.  He lives at a facility "[facility]".  Clinical Impression   Pt admitted with above hx and above dx. Pt is oriented to self mostly hence some hx he provides may be unreliable. He reports was living at facility and was able to ambulate with walker, he states he needs assistance with bathing dressing etc. This am pt is lethargic, he was agreeable to assessment and tx but tolerated minimally. Pt needed mod a with bed mob, was able to sit supported edge of bed, he c/o dizziness with sitting, therapist attempted to get set of orthostatic BPs but was unable to. Pt was able to stand briefly with mod/max a and RW, max cues for walker use. Pt unable to take any steps today, attempted to take steps but pt only able to shuffle feet and not advance. Pt will benefit from continued PT tx while in hospital, at dc he may return to home facility, where he will have needed level of assistance as previous,he may continue with rehabilitation if skill is available.      Follow Up Recommendations SNF(return to home facility)    Equipment Recommendations  None recommended by PT    Recommendations for Other Services       Precautions / Restrictions Precautions Precautions: Fall;Other (comment)(highly impulsive) Restrictions Weight Bearing Restrictions: No      Mobility  Bed Mobility Overal bed mobility: Needs Assistance Bed Mobility: Supine to Sit;Sit to Supine     Supine to sit:  Mod assist Sit to supine: Mod assist   General bed mobility comments: needs a with BLE movement   Transfers Overall transfer level: Needs assistance Equipment used: Rolling walker (2 wheeled) Transfers: Sit to/from Stand Sit to Stand: Max assist            Ambulation/Gait             General Gait Details: attempted to take some steps at edge of bed and pt unable to unweight either leg shuffling feet along floor  Stairs            Wheelchair Mobility    Modified Rankin (Stroke Patients Only)       Balance Overall balance assessment: Needs assistance Sitting-balance support: Feet supported Sitting balance-Leahy Scale: Fair     Standing balance support: Bilateral upper extremity supported;During functional activity Standing balance-Leahy Scale: Poor                               Pertinent Vitals/Pain Pain Assessment: No/denies pain    Home Living Family/patient expects to be discharged to:: Assisted living               Home Equipment: Walker - 2 wheels      Prior Function Level of Independence: Needs assistance   Gait / Transfers Assistance Needed: ambulated with walker as per own reporting  ADL's / Homemaking Assistance Needed: assisted by staff at  facility        [place]  Extremity/Trunk Assessment   Upper Extremity Assessment Upper Extremity Assessment: Generalized weakness    Lower Extremity Assessment Lower Extremity Assessment: Generalized weakness    Cervical / Trunk Assessment Cervical / Trunk Assessment: Kyphotic  Communication   Communication: HOH  Cognition Arousal/Alertness: Awake/alert Behavior During Therapy: Impulsive Overall Cognitive Status: No family/caregiver present to determine baseline cognitive functioning                                        General Comments General comments (skin integrity, edema, etc.): pt c/o dizziness with sitting edge of bed,  attempted to get BP but unable to get one in stance as pt needed max a to maintain stance    Exercises     Assessment/Plan    PT Assessment Patient needs continued PT services  PT Problem List Decreased strength;Decreased activity tolerance;Decreased balance;Decreased mobility;Decreased coordination;Decreased cognition;Decreased knowledge of use of DME;Decreased safety awareness       PT Treatment Interventions DME instruction;Gait training;Functional mobility training;Therapeutic activities;Therapeutic exercise;Balance training;Neuromuscular re-education;Patient/family education    PT Goals (Current goals can be found in the Care Plan section)  Acute Rehab PT Goals Patient Stated Goal: did not voice any goals PT Goal Formulation: Patient unable to participate in goal setting Time For Goal Achievement: 09/09/19 Potential to Achieve Goals: Fair    Frequency Min 2X/week   Barriers to discharge        Co-evaluation               AM-PAC PT "6 Clicks" Mobility  Outcome Measure Help needed turning from your back to your side while in a flat bed without using bedrails?: A Little Help needed moving from lying on your back to sitting on the side of a flat bed without using bedrails?: A Lot Help needed moving to and from a bed to a chair (including a wheelchair)?: A Lot Help needed standing up from a chair using your arms (e.g., wheelchair or bedside chair)?: A Lot Help needed to walk in hospital room?: Total Help needed climbing 3-5 steps with a railing? : Total 6 Click Score: 11    End of Session   Activity Tolerance: Patient limited by fatigue;Patient limited by lethargy Patient left: in bed;with call bell/phone within reach;with bed alarm set   PT Visit Diagnosis: Unsteadiness on feet (R26.81);Other abnormalities of gait and mobility (R26.89)    Time: 1130-1153 PT Time Calculation (min) (ACUTE ONLY): 23 min   Charges:   PT Evaluation $PT Eval Moderate Complexity: 1  Mod PT Treatments $Therapeutic Activity: 8-22 mins        Horald Chestnut, PT   Delford Field 08/26/2019, 12:37 PM

## 2019-08-26 NOTE — Progress Notes (Addendum)
PROGRESS NOTE    Nicolas Snyder  LOV:[id number] DOB: 1921-04-25 DOA: 08/21/2019 PCP: Haywood Pao, MD    Brief Narrative:  Patient was admitted to the hospital with a working diagnosis of acute hypoxic respiratory failure due to SARS COVID-19 viral pneumonia complicated by acute on chronic kidney injury/to rule out bacterial pneumonia coinfection complicated with sepsis (present on admission).  86 year old male who presented with fever.  He does have significant past medical history for dyslipidemia, hypertension, type II diabetes mellitus, paroxysmal atrial fibrillation, hypothyroidism and chronic kidney disease stage IV.  He lives at a facility "[facility]", reported to have poor appetite, poor oral intake and dehydration.  He was seen week and lethargic.  On his initial physical examination his temperature was 101.8 F, heart rate 103, respiratory 27, blood pressure 142/99, oxygen saturation 92% on room air.  His lungs had bibasilar rales, no wheezing, heart S1-S2, present rhythmic, soft abdomen, no lower extremity edema.  Sodium 133, potassium 3.1, chloride 96, bicarb 22, glucose 258, BUN 93, creatinine 4.13, white count 11.5, hemoglobin 14.6, hematocrit 43.6, platelets 175.  SARS COVID-19 was positive.  Urine analysis 11-20 white cells, specific gravity 1.0 14, protein 100.  Chest radiograph had a patchy infiltrate at the base of the right lower lobe, faint bibasilar interstitial infiltrates.  His EKG had 110 bpm, right axis deviation, right bundle branch block, atrial fibrillation rhythm, inferior Q waves, positive PVC, no ST segment or significant T wave changes.  Patient has received medical therapy with remdesivir, steroids and antibiotics.  Further work-up with echocardiogram revealed ejection fraction of 20%  Developed pulmonary edema, that improved with diuresis.   Patient very weak and deconditioned, his renal function still not stabilized.    Assessment & Plan:   Principal  Problem:   Pneumonia due to COVID-19 virus Active Problems:   AF (atrial fibrillation) (HCC)   DM (diabetes mellitus) ([place])   Hypertension   Hyperlipemia   Hypothyroidism   AKI (acute kidney injury) ([place])   Hypokalemia   COVID-19 virus infection   Severe sepsis (HCC)   Chronic systolic (congestive) heart failure ([place])   CKD stage 4 due to type 2 diabetes mellitus ([place])   1.  Acute hypoxic respiratory failure due to SARS COVID-19 viral pneumonia complicated with sepsis due to bacterial pneumonia coinfection,(end-organ failure, acute kidney injury and lactic acidosis). Sp Remdesivir #5/5,Cefepime #5.   RR: 18  Pulse oxymetry: 93%  Fi02: 21% room air.   COVID-19 Labs  Recent Labs    08/24/19 0331 08/25/19 0454 08/26/19 0334  DDIMER 0.91* 0.90* 1.28*  FERRITIN 3,866* 2,779* 2,479*  CRP 14.0* 8.0* 5.6*    No results found for: SARSCOV2NAA  Inflammatory markers continue to trend down, oxygen requirements have been stable.  His follow up chest film personally reviewed noted bilateral reticulonodular infiltrates right upper lobe, right lower lobe and left lower lobe.   Will continue steroids with dexamethasone 6 mg q 24 H/ #6. On antitussive agents, bronchodilators and airway clearing techniques with flutter valve and incentive spirometer. Has completed antibiotic therapy, wbc at 13.2 likely due to steroids, will follow cbc in 48 H.   Continue to encourage out of bed to chair tid with meals, physical and occupational therapy evaluation and nutritional supplementation.   Continue with dysphagia 2 and aspiration precautions.  2. Paroxysmal atrial fibrillation. On diltiazem for rate control, not candidate for anticoagulation due to debility and high fall risk. Continue telemetry monitoring.   3. Uncontrolled T2DM ( Hgb A1c  8.4), with steroid induced hyperglycemia. Dyslipidemia. Fasting glucose this am 115,continue with basal basal insulin 15 units bid, plus insulin sliding  scale for glucose cover and monitoring.  Continue fenofibrate and pravastatin.    4. CKD stage IV with hypokalemia, anion gap metabolic acidosis. Serum cr today is 4,0, K down to 3,2 with Na 137, Cl 105 and bicarbonate at 16. Urine output 1,450 ml over last 24 H, (patient has a foley catheter).   Will add 40 kcl po for K correction and will add gentle hydration with bicarb drip for 1000 ml. Follow renal panel in am, avoid hypotension and nephrotoxic medications.   Patient continue to be at a very high risk for worsening renal function.  5. HTN. Blood pressure 137/73, will continue close monitoring.   6. Acute on chronic systolic heart failure. Echocardiogram with LV systolic function 20 to 10%, preserved RV systolic function. Today with no signs of decompensation, will continue carvedilol for now and will continue to hold RAAS inhibition due to risk of hypotension and worsening renal function.   7. Hypothyroid. On levothyroxine.   8. Metabolic encephalopathy. Today patient is more awake and alert, answering to simple questions and following simple commands.   9. Unspecified calorie protein malnutrition. Nutritional supplementation as tolerated.   DVT prophylaxis: enoxaparin   Code Status:  DNR  Family Communication: I spoke over the phone with the patient's son about patient's  condition, plan of care, prognosis and all questions were addressed. Disposition Plan/ discharge barriers: patient continue acutely ill, will likely need SNF.    Nutrition Status: Nutrition Problem: Increased nutrient needs Etiology: acute illness, catabolic illness(COVID) Signs/Symptoms: estimated needs Interventions: Ensure Enlive (each supplement provides 350kcal and 20 grams of protein), Magic cup, Glucerna shake, MVI     Subjective: Today patient has been more calm, able to respond to questions and follow commands, continue to be very weak and deconditioned. Poor oral intake.    Objective: Vitals:   08/26/19 0300 08/26/19 0355 08/26/19 0400 08/26/19 0727  BP:  (!) 124/55  124/88  Pulse: 83 (!) 36  74  Resp: 14 18  18   Temp:  (!) 97.4 F (36.3 C) 98.5 F (36.9 C)   TempSrc:  Axillary Axillary   SpO2: 92% 93%  93%  Weight:      Height:        Intake/Output Summary (Last 24 hours) at 08/26/2019 0838 Last data filed at 08/26/2019 0700 Gross per 24 hour  Intake 600 ml  Output 1450 ml  Net -850 ml   Filed Weights   08/22/19 0638 08/24/19 0454  Weight: 65.4 kg 65.4 kg    Examination:   General: Not in pain or dyspnea, deconditioned  Neurology: Awake and alert, non focal  E ENT: mild pallor, no icterus, oral mucosa moist Cardiovascular: No JVD. S1-S2 present, rhythmic. No lower extremity edema. Pulmonary: positive breath sounds bilaterally. Gastrointestinal. Abdomen with no organomegaly, non tender, no rebound or guarding Skin. No rashes Musculoskeletal: no joint deformities     Data Reviewed: I have personally reviewed following labs and imaging studies  CBC: Recent Labs  Lab 08/22/19 0045 08/22/19 0045 08/22/19 1243 08/23/19 0207 08/24/19 0331 08/25/19 0454 08/26/19 0334  WBC 14.6*   < > 16.5* 11.6* 11.5* 12.3* 13.2*  NEUTROABS 13.9*  --   --  9.1* 10.7* 10.9* 11.5*  HGB 14.6   < > 15.7 13.3 13.3 12.5* 13.0  HCT 42.8   < > 47.3 39.5 39.6 36.5* 37.9*  MCV  91.3   < > 92.0 90.2 91.0 89.2 88.8  PLT 182   < > 200 202 240 277 289   < > = values in this interval not displayed.   Basic Metabolic Panel: Recent Labs  Lab 08/23/19 0207 08/23/19 1600 08/24/19 0331 08/25/19 0454 08/26/19 0334  NA 134* 134* 134* 139 137  K 3.1* 3.2* 3.4* 3.1* 3.2*  CL 101 101 101 107 105  CO2 19* 18* 17* 17* 16*  GLUCOSE 223* 320* 370* 142* 115*  BUN 106* 95* 115* 129* 147*  CREATININE 3.59* 3.70* 3.45* 3.57* 4.01*  CALCIUM 7.4* 7.3* 7.5* 8.0* 8.1*   GFR: Estimated Creatinine Clearance: 10 mL/min (A) (by C-G formula based on SCr of 4.01 mg/dL  (H)). Liver Function Tests: Recent Labs  Lab 08/22/19 0045 08/23/19 0207 08/24/19 0331 08/25/19 0454 08/26/19 0334  AST 49* 46* 35 28 26  ALT 25 30 28 24 26   ALKPHOS 33* 33* 39 34* 34*  BILITOT 1.3* 0.8 0.8 0.9 0.7  PROT 5.6* 5.2* 5.3* 5.4* 5.6*  ALBUMIN 2.4* 2.2* 2.4* 2.9* 2.8*   No results for input(s): LIPASE, AMYLASE in the last 168 hours. No results for input(s): AMMONIA in the last 168 hours. Coagulation Profile: No results for input(s): INR, PROTIME in the last 168 hours. Cardiac Enzymes: No results for input(s): CKTOTAL, CKMB, CKMBINDEX, TROPONINI in the last 168 hours. BNP (last 3 results) No results for input(s): PROBNP in the last 8760 hours. HbA1C: Recent Labs    08/23/19 1600  HGBA1C 8.7*   CBG: Recent Labs  Lab 08/25/19 0808 08/25/19 1058 08/25/19 1521 08/25/19 2010 08/26/19 0724  GLUCAP 219* 267* 270* 258* 93   Lipid Profile: No results for input(s): CHOL, HDL, LDLCALC, TRIG, CHOLHDL, LDLDIRECT in the last 72 hours. Thyroid Function Tests: No results for input(s): TSH, T4TOTAL, FREET4, T3FREE, THYROIDAB in the last 72 hours. Anemia Panel: Recent Labs    08/25/19 0454 08/26/19 0334  FERRITIN 2,779* 2,479*      Radiology Studies: I have reviewed all of the imaging during this hospital visit personally     Scheduled Meds: . aspirin EC  81 mg Oral Daily  . Chlorhexidine Gluconate Cloth  6 each Topical Daily  . dexamethasone (DECADRON) injection  6 mg Intravenous Q24H  . diltiazem  360 mg Oral Daily  . docusate sodium  100 mg Oral Q12H  . feeding supplement (ENSURE ENLIVE)  237 mL Oral TID BM  . feeding supplement (PRO-STAT SUGAR FREE 64)  30 mL Oral BID  . fenofibrate  54 mg Oral Daily  . heparin  5,000 Units Subcutaneous Q8H  . insulin aspart  0-15 Units Subcutaneous TID WC  . insulin aspart  0-5 Units Subcutaneous QHS  . insulin detemir  15 Units Subcutaneous BID  . levothyroxine  75 mcg Oral Daily  . pantoprazole  40 mg Oral  Daily  . pravastatin  10 mg Oral Daily  . tamsulosin  0.4 mg Oral QPC supper   Continuous Infusions: . ceFEPime (MAXIPIME) IV 1 g (08/25/19 1500)     LOS: 5 days        Tiombe Tomeo Gerome Apley, MD

## 2019-08-27 ENCOUNTER — Inpatient Hospital Stay (HOSPITAL_COMMUNITY): Payer: Medicare Other

## 2019-08-27 DIAGNOSIS — I10 Essential (primary) hypertension: Secondary | ICD-10-CM

## 2019-08-27 LAB — BASIC METABOLIC PANEL
Anion gap: 14 (ref 5–15)
BUN: 150 mg/dL — ABNORMAL HIGH (ref 8–23)
CO2: 17 mmol/L — ABNORMAL LOW (ref 22–32)
Calcium: 8.1 mg/dL — ABNORMAL LOW (ref 8.9–10.3)
Chloride: 105 mmol/L (ref 98–111)
Creatinine, Ser: 4.31 mg/dL — ABNORMAL HIGH (ref 0.61–1.24)
GFR calc Af Amer: 13 mL/min — ABNORMAL LOW (ref >60)
GFR calc non Af Amer: 11 mL/min — ABNORMAL LOW (ref >60)
Glucose, Bld: 59 mg/dL — ABNORMAL LOW (ref 70–99)
Potassium: 3.3 mmol/L — ABNORMAL LOW (ref 3.5–5.1)
Sodium: 136 mmol/L (ref 135–145)

## 2019-08-27 LAB — GLUCOSE, CAPILLARY
Glucose-Capillary: 58 mg/dL — ABNORMAL LOW (ref 70–99)
Glucose-Capillary: 88 mg/dL (ref 70–99)
Glucose-Capillary: 115 mg/dL — ABNORMAL HIGH (ref 70–99)
Glucose-Capillary: 101 mg/dL — ABNORMAL HIGH (ref 70–99)
Glucose-Capillary: 156 mg/dL — ABNORMAL HIGH (ref 70–99)

## 2019-08-27 MED ORDER — INSULIN DETEMIR 100 UNIT/ML ~~LOC~~ SOLN
8.0000 [IU] | Freq: Two times a day (BID) | SUBCUTANEOUS | Status: DC
  Administered 2019-08-27 – 2019-08-31 (×9): 8 [IU] via SUBCUTANEOUS
  Filled 2019-08-27 (×10): qty 0.08

## 2019-08-27 MED ORDER — DEXAMETHASONE SODIUM PHOSPHATE 10 MG/ML IJ SOLN
6.0000 mg | INTRAMUSCULAR | Status: AC
  Administered 2019-08-27 – 2019-08-30 (×4): 6 mg via INTRAVENOUS
  Filled 2019-08-27 (×4): qty 1

## 2019-08-27 NOTE — Progress Notes (Signed)
Physical Therapy Treatment Patient Details Name: Nicolas Snyder MRN: [id number] DOB: [birth year] Today's Date: 08/27/2019    History of Present Illness Patient was admitted to the hospital with a working diagnosis of acute hypoxic respiratory failure due to SARS COVID-19 viral pneumonia complicated by acute on chronic kidney injury/to rule out bacterial sepsis. 86 y/o pt w/ past medical history for dyslipidemia, hypertension, type II times mellitus, paroxysmal atrial fibrillation, hypothyroidism and chronic kidney disease stage III.  He lives at a facility "[facility]".    PT Comments    Pt participated in tx well, needed max encouragement to complete tasks and even then completes tasks very lethargically. worked on seated exercises in chair, pt extremely slow with these, worked on sit<>stand from chair pt need max a to stand to walker, able to stand <66min and complete marching x 5 B. Pt needed cues and reinforcement to clear BLE. Pt was on room air and managed to keep sats in 90s but with standing and marching activity noted desat to low 80s. At completion of tasks pt sitting in recliner, all needs in reach and on room air sats in low 90s.     Follow Up Recommendations  SNF     Equipment Recommendations  None recommended by PT    Recommendations for Other Services       Precautions / Restrictions Precautions Precautions: Fall;Other (comment) Restrictions Weight Bearing Restrictions: No    Mobility  Bed Mobility               General bed mobility comments: Pt received sitting in recliner  Transfers Overall transfer level: Needs assistance Equipment used: Rolling walker (2 wheeled) Transfers: Sit to/from Stand Sit to Stand: Max assist         General transfer comment: needed max a to stand from recliner and multi attempts  Ambulation/Gait             General Gait Details: attempted to take some steps but pt states he 'cant" was minimally able to march in place  in front of recliner.   Stairs             Wheelchair Mobility    Modified Rankin (Stroke Patients Only)       Balance Overall balance assessment: Needs assistance Sitting-balance support: Feet unsupported Sitting balance-Leahy Scale: Fair     Standing balance support: During functional activity;Bilateral upper extremity supported Standing balance-Leahy Scale: Poor Standing balance comment: some retropulsion noted with standing                            Cognition Arousal/Alertness: Lethargic Behavior During Therapy: Flat affect Overall Cognitive Status: No family/caregiver present to determine baseline cognitive functioning                                        Exercises General Exercises - Lower Extremity Ankle Circles/Pumps: AROM;Strengthening;10 reps;Both Long Arc Quad: AROM;Strengthening;Both;10 reps Hip Flexion/Marching: AROM;Strengthening;Both;10 reps    General Comments        Pertinent Vitals/Pain Pain Assessment: Faces Faces Pain Scale: Hurts even more Pain Location: w/ activity seems to have pain in BLE Pain Descriptors / Indicators: Grimacing;Discomfort;Guarding Pain Intervention(s): Limited activity within patient's tolerance    Home Living                      Prior  Function            PT Goals (current goals can now be found in the care plan section) Acute Rehab PT Goals Patient Stated Goal: did not voice any goals, but agreeable that he would like to walk again PT Goal Formulation: Patient unable to participate in goal setting Time For Goal Achievement: 09/09/19 Potential to Achieve Goals: Fair Progress towards PT goals: Progressing toward goals    Frequency    Min 2X/week      PT Plan Current plan remains appropriate    Co-evaluation              AM-PAC PT "6 Clicks" Mobility   Outcome Measure  Help needed turning from your back to your side while in a flat bed without using  bedrails?: A Little Help needed moving from lying on your back to sitting on the side of a flat bed without using bedrails?: A Lot Help needed moving to and from a bed to a chair (including a wheelchair)?: A Lot Help needed standing up from a chair using your arms (e.g., wheelchair or bedside chair)?: A Lot Help needed to walk in hospital room?: Total Help needed climbing 3-5 steps with a railing? : Total 6 Click Score: 11    End of Session Equipment Utilized During Treatment: Gait belt Activity Tolerance: Patient limited by fatigue;Patient limited by lethargy;Treatment limited secondary to medical complications (Comment) Patient left: in chair;with call bell/phone within reach;with chair alarm set   PT Visit Diagnosis: Unsteadiness on feet (R26.81);Other abnormalities of gait and mobility (R26.89)     Time: 1941-7408 PT Time Calculation (min) (ACUTE ONLY): 24 min  Charges:  $Therapeutic Exercise: 8-22 mins $Therapeutic Activity: 8-22 mins                     Horald Chestnut, PT    Delford Field 08/27/2019, 12:29 PM

## 2019-08-27 NOTE — Progress Notes (Addendum)
Occupational therapy Treatment Note  Clinical Impression Patient has demonstrated slowy progress with participation level secondary to decreased functional activity tolerance. Patient was noted with swelling in L UE and administered pillows to decrease swelling. Patient given visual demonstration of each B UE exercise requiring AAROM for proper technique. Patient was able to assist position to top of bed with use of R UE, bed rail, and bed in [place] at Max A level. Patient required Moderate Assist with washing face, requiring hand on hand guidance to ensure proper grip on washcloth and cue to initiate task. O2 stats remained in the 90s throughout treatment session on room air. Patient will benefit from continued skilled OT acute services.   08/27/19 1700  OT Visit Information  Assistance Needed +1  History of Present Illness Patient was admitted to the hospital with a working diagnosis of acute hypoxic respiratory failure due to SARS COVID-19 viral pneumonia complicated by acute on chronic kidney injury/to rule out bacterial sepsis. 86 y/o pt w/ past medical history for dyslipidemia, hypertension, type II times mellitus, paroxysmal atrial fibrillation, hypothyroidism and chronic kidney disease stage III.  He lives at a facility "[facility]".  Precautions  Precautions Fall;Other (comment)  Pain Assessment  Pain Assessment No/denies pain  Cognition  Arousal/Alertness Lethargic  Behavior During Therapy Flat affect  ADL  Grooming Wash/dry face;Bed level;Moderate assistance  General Comments  General comments (skin integrity, edema, etc.) Edema noted in L UE, elevation with pillows  Exercises  Exercises General Upper Extremity;Other exercises  General Exercises - Upper Extremity  Shoulder Flexion AAROM  Shoulder Extension AAROM  Shoulder ABduction AAROM  Shoulder ADduction AAROM  Shoulder Horizontal ABduction AAROM  Shoulder Horizontal ADduction AAROM  Elbow Flexion AAROM  Elbow  Extension AAROM  Other Exercises  Other Exercises  (flutter valve for 10 reps)  Other Exercises  (incentive spirometer x 10 reps wtih average of 362mL)  OT - End of Session  Activity Tolerance Patient limited by lethargy  Patient left in bed;with call bell/phone within reach;with bed alarm set  Nurse Communication Mobility status  OT Assessment/Plan  OT Plan Discharge plan remains appropriate  Follow Up Recommendations SNF  OT Equipment 3 in 1 bedside commode  AM-PAC OT "6 Clicks" Daily Activity Outcome Measure (Version 2)  Help from another person eating meals? 2  Help from another person taking care of personal grooming? 2  Help from another person toileting, which includes using toliet, bedpan, or urinal? 1  Help from another person bathing (including washing, rinsing, drying)? 2  Help from another person to put on and taking off regular upper body clothing? 2  Help from another person to put on and taking off regular lower body clothing? 1  6 Click Score 10  OT Goal Progression  Progress towards OT goals Progressing toward goals  OT Time Calculation  OT Start Time (ACUTE ONLY) 1517  OT Stop Time (ACUTE ONLY) 1550  OT Time Calculation (min) 33 min  OT General Charges  $OT Visit 1 Visit  OT Treatments  $Self Care/Home Management  8-22 mins  $Therapeutic Exercise 8-22 mins   Tihanna Goodson OTR/L

## 2019-08-27 NOTE — Progress Notes (Signed)
Pt tolerated sitting in chair for approx 3hrs today. x2 assist w/transfers and tolerated fair. Wound care performed to skin tear to L elbow, dressing in place. Diet changed to pureed diet d/t pt not fully masticating food and also pocketing of food observed. Oral suction set-up bedside. Nothing further to note.

## 2019-08-27 NOTE — Progress Notes (Signed)
   08/27/19 1200  Family/Significant Other Communication  Family/Significant Other Update Called;Other (Comment) ([place])

## 2019-08-27 NOTE — Progress Notes (Signed)
PROGRESS NOTE  Nicolas Snyder TZG:[id number] DOB: 05-25-1921 DOA: 08/21/2019 PCP: Haywood Pao, MD   LOS: 6 days   Brief Narrative / Interim history: 86 year old male with hyperlipidemia, hypertension, DM 2, paroxysmal A. fib, hypothyroidism, CKD stage IV who [facility] came into the hospital with COVID-19 pneumonia, weakness, lethargy.  He was initially febrile, tachycardic, tachypneic.  Hospital course was complicated by worsening renal function as well as a 2D echo showed new onset systolic heart failure.  Subjective / 24h Interval events: Sleeping when I entered the room, answers basic questions but has underlying dementia  Assessment & Plan:  Principal Problem Acute Hypoxic Respiratory Failure due to Covid-19 Viral Illness -Patient has completed 5 days of remdesivir while hospitalized, was also placed and completed 5 days of cefepime -Hypoxia is stable today, requiring 2 L alternating with room air -Continue steroids, today day #7 -Continue supportive treatment   COVID-19 Labs  Recent Labs    08/25/19 0454 08/26/19 0334  DDIMER 0.90* 1.28*  FERRITIN 2,779* 2,479*  CRP 8.0* 5.6*   Active Problems Paroxysmal A. fib -Continue diltiazem for rate control, not a candidate for anticoagulation due to debility and high fall risk  Acute kidney injury on chronic kidney disease stage IV -Unclear baseline creatinine, he was in the 2 range about 9 years ago in 2012, now in the 4 range. -He is not making much urine, 750 cc over the last 24 hours and he is net positive 5 L -Discontinue IV fluids today, he is very high risk of becoming fluid overload -Remains acidotic, confers him a very poor prognosis  Acute systolic CHF, EF 75-91%, troponin elevation due to demand ischemia versus non-STEMI -2D echo as below with wall motion abnormalities and depressed EF -High-sensitivity troponin elevated to 1500 on admission and trending down most recent 07/11/2011 on 2/14 -Possibly  that he had a cardiac event prior to admission, suggesting very poor prognosis  Essential hypertension -Monitor, continue diltiazem  Hypothyroidism -Continue Synthroid  Metabolic encephalopathy -Slightly more alert per prior notes, however with his renal function getting worse he may become more encephalopathic soon  Unspecified protein calorie malnutrition -Nutritional supplements as tolerated   Goals of care -d/w with son over the phone, updated him on the fact that he now has heart failure as well as progressive renal failure, and my concern that his prognosis is quite grim unless his creatinine improves.  He agrees with medical management, no dialysis, is aware that his father may be dying from this and wishes if things get turn for the worse for [place] to focus on comfort.  Scheduled Meds: . aspirin EC  81 mg Oral Daily  . Chlorhexidine Gluconate Cloth  6 each Topical Daily  . diltiazem  360 mg Oral Daily  . docusate sodium  100 mg Oral Q12H  . feeding supplement (ENSURE ENLIVE)  237 mL Oral TID BM  . feeding supplement (PRO-STAT SUGAR FREE 64)  30 mL Oral BID  . fenofibrate  54 mg Oral Daily  . heparin  5,000 Units Subcutaneous Q8H  . insulin aspart  0-15 Units Subcutaneous TID WC  . insulin aspart  0-5 Units Subcutaneous QHS  . insulin detemir  8 Units Subcutaneous BID  . levothyroxine  75 mcg Oral Daily  . pantoprazole  40 mg Oral Daily  . pravastatin  10 mg Oral Daily  . tamsulosin  0.4 mg Oral QPC supper   Continuous Infusions: .  sodium bicarbonate (isotonic) infusion in sterile water  50 mL/hr at 08/26/19 1805   PRN Meds:.ramelteon  DVT prophylaxis: heparin Code Status: DNR Family Communication: Discussed with son, [place], [phone] Patient admitted from: SNF Anticipated d/c place: To be determined Barriers to d/c: Progressive renal failure  Consultants:  None   Procedures:  2D echo:  1. Septal apical mid and apical inferior wall akinesis . Left  ventricular  ejection fraction, by estimation, is 25 to 30%. The left ventricle has  severely decreased function. The left ventricle demonstrates regional wall  motion abnormalities (see scoring diagram/findings for description). The left ventricular internal cavity size was moderately dilated. Left ventricular diastolic parameters are indeterminate.  2. Right ventricular systolic function is normal. The right ventricular  size is normal. There is moderately elevated pulmonary artery systolic  pressure.  3. Left atrial size was moderately dilated.  4. Right atrial size was moderately dilated.  5. The mitral valve is normal in structure and function. Moderate mitral  valve regurgitation. No evidence of mitral stenosis.  6. Tricuspid valve regurgitation is moderate.  7. The aortic valve is tricuspid. Aortic valve regurgitation is not  visualized. Mild to moderate aortic valve sclerosis/calcification is  present, without any evidence of aortic stenosis.   Microbiology: None   Antibacterials: Cefepime s/p 5 days    Objective: Vitals:   08/27/19 0200 08/27/19 0400 08/27/19 0500 08/27/19 0743  BP:  (!) 130/59  116/74  Pulse: (!) 58 (!) 50  60  Resp: 18 18  14   Temp:      TempSrc:      SpO2: 93% 94%  93%  Weight:   76.4 kg   Height:        Intake/Output Summary (Last 24 hours) at 08/27/2019 1047 Last data filed at 08/27/2019 0533 Gross per 24 hour  Intake 935 ml  Output 750 ml  Net 185 ml   Filed Weights   08/22/19 0638 08/24/19 0454 08/27/19 0500  Weight: 65.4 kg 65.4 kg 76.4 kg    Examination:  Constitutional: NAD Eyes: no scleral icterus ENMT: Mucous membranes are moist.  Neck: normal, supple Respiratory: Bibasilar crackles, no wheezing.  Normal respiratory effort Cardiovascular: Irregular, no murmurs, trace edema Abdomen: non distended, no tenderness. Bowel sounds positive.  Musculoskeletal: no clubbing / cyanosis.  Skin: no rashes appreciated Neurologic:  Grossly nonfocal, does not follow commands consistently  Data Reviewed: I have independently reviewed following labs and imaging studies   CBC: Recent Labs  Lab 08/22/19 0045 08/22/19 0045 08/22/19 1243 08/23/19 0207 08/24/19 0331 08/25/19 0454 08/26/19 0334  WBC 14.6*   < > 16.5* 11.6* 11.5* 12.3* 13.2*  NEUTROABS 13.9*  --   --  9.1* 10.7* 10.9* 11.5*  HGB 14.6   < > 15.7 13.3 13.3 12.5* 13.0  HCT 42.8   < > 47.3 39.5 39.6 36.5* 37.9*  MCV 91.3   < > 92.0 90.2 91.0 89.2 88.8  PLT 182   < > 200 202 240 277 289   < > = values in this interval not displayed.   Basic Metabolic Panel: Recent Labs  Lab 08/23/19 1600 08/24/19 0331 08/25/19 0454 08/26/19 0334 08/27/19 0220  NA 134* 134* 139 137 136  K 3.2* 3.4* 3.1* 3.2* 3.3*  CL 101 101 107 105 105  CO2 18* 17* 17* 16* 17*  GLUCOSE 320* 370* 142* 115* 59*  BUN 95* 115* 129* 147* 150*  CREATININE 3.70* 3.45* 3.57* 4.01* 4.31*  CALCIUM 7.3* 7.5* 8.0* 8.1* 8.1*   GFR: Estimated Creatinine Clearance: 10.4  mL/min (A) (by C-G formula based on SCr of 4.31 mg/dL (H)). Liver Function Tests: Recent Labs  Lab 08/22/19 0045 08/23/19 0207 08/24/19 0331 08/25/19 0454 08/26/19 0334  AST 49* 46* 35 28 26  ALT 25 30 28 24 26   ALKPHOS 33* 33* 39 34* 34*  BILITOT 1.3* 0.8 0.8 0.9 0.7  PROT 5.6* 5.2* 5.3* 5.4* 5.6*  ALBUMIN 2.4* 2.2* 2.4* 2.9* 2.8*   No results for input(s): LIPASE, AMYLASE in the last 168 hours. No results for input(s): AMMONIA in the last 168 hours. Coagulation Profile: No results for input(s): INR, PROTIME in the last 168 hours. Cardiac Enzymes: No results for input(s): CKTOTAL, CKMB, CKMBINDEX, TROPONINI in the last 168 hours. BNP (last 3 results) No results for input(s): PROBNP in the last 8760 hours. HbA1C: No results for input(s): HGBA1C in the last 72 hours. CBG: Recent Labs  Lab 08/26/19 1138 08/26/19 1744 08/26/19 1945 08/27/19 0739 08/27/19 0933  GLUCAP 119* 135* 126* 58* 88   Lipid  Profile: No results for input(s): CHOL, HDL, LDLCALC, TRIG, CHOLHDL, LDLDIRECT in the last 72 hours. Thyroid Function Tests: No results for input(s): TSH, T4TOTAL, FREET4, T3FREE, THYROIDAB in the last 72 hours. Anemia Panel: Recent Labs    08/25/19 0454 08/26/19 0334  FERRITIN 2,779* 2,479*   Urine analysis:    Component Value Date/Time   COLORURINE YELLOW 08/21/2019 2230   APPEARANCEUR HAZY (A) 08/21/2019 2230   LABSPEC 1.014 08/21/2019 2230   PHURINE 5.0 08/21/2019 2230   GLUCOSEU NEGATIVE 08/21/2019 2230   HGBUR SMALL (A) 08/21/2019 2230   BILIRUBINUR NEGATIVE 08/21/2019 2230   KETONESUR NEGATIVE 08/21/2019 2230   PROTEINUR 100 (A) 08/21/2019 2230   UROBILINOGEN 0.2 10/07/2010 1715   NITRITE NEGATIVE 08/21/2019 2230   LEUKOCYTESUR TRACE (A) 08/21/2019 2230   Sepsis Labs: Invalid input(s): PROCALCITONIN, LACTICIDVEN  Recent Results (from the past 240 hour(s))  Blood Culture (routine x 2)     Status: None   Collection Time: 08/21/19  4:45 PM   Specimen: BLOOD  Result Value Ref Range Status   Specimen Description BLOOD LEFT ANTECUBITAL  Final   Special Requests   Final    BOTTLES DRAWN AEROBIC AND ANAEROBIC Blood Culture adequate volume   Culture   Final    NO GROWTH 5 DAYS Performed at [facility], [address] [place], [place] [address]    Report Status 08/26/2019 FINAL  Final  Blood Culture (routine x 2)     Status: None   Collection Time: 08/21/19  6:00 PM   Specimen: BLOOD RIGHT ARM  Result Value Ref Range Status   Specimen Description BLOOD RIGHT ARM  Final   Special Requests   Final    BOTTLES DRAWN AEROBIC AND ANAEROBIC Blood Culture adequate volume   Culture   Final    NO GROWTH 5 DAYS Performed at [facility], [place] [address], [place], [place] [address]    Report Status 08/26/2019 FINAL  Final      Radiology Studies: DG Chest 1 View  Result Date: 08/25/2019 CLINICAL DATA:  Dyspnea.  COVID-19 pneumonia. EXAM: CHEST  1 VIEW  COMPARISON:  08/21/2019 FINDINGS: Patient rotated right. Mild cardiomegaly. No definite pleural fluid. No pneumothorax. Worsened interstitial opacities throughout the right lung with relative sparing of the right lung base. Developing vague left lower lobe pulmonary opacity. IMPRESSION: Progressive right and suspicion of developing left lower lobe multifocal COVID-19 pneumonia. Electronically Signed   By: Adria Devon.D.  On: 08/25/2019 14:18   Marzetta Board, MD, PhD [facility]  Between 7 am - 7 pm I am available, please contact me via Amion or Securechat  Between 7 pm - 7 am I am not available, please contact night coverage MD/APP via Amion

## 2019-08-27 NOTE — Progress Notes (Signed)
Inpatient Diabetes Program Recommendations  AACE/ADA: New Consensus Statement on Inpatient Glycemic Control (2015)  Target Ranges:  Prepandial:   less than 140 mg/dL      Peak postprandial:   less than 180 mg/dL (1-2 hours)      Critically ill patients:  140 - 180 mg/dL   Lab Results  Component Value Date   GLUCAP 58 (L) 08/27/2019   HGBA1C 8.7 (H) 08/23/2019    Review of Glycemic Control  Results for KINNEY, SACKMANN (MRN [id number]) as of 08/27/2019 08:34  Ref. Range 08/26/2019 11:38 08/26/2019 17:44 08/26/2019 19:45 08/27/2019 02:20 08/27/2019 07:39  Glucose-Capillary Latest Ref Range: 70 - 99 mg/dL 119 (H) 135 (H) 126 (H)  58 (L)    Diabetes history: DM2 Outpatient Diabetes medications: Glipizide 10 mg Daily Current orders for Inpatient glycemic control: Novolog 0-15 TID with meals + 0-5 units at bedtime + Levemir 15 units Twice Daily  Inpatient Diabetes Program Recommendations:     Noted hypoglycemia at 2 am (59mg /dl) and 7am (58mg /dl)  -Please consider Levemir 10 units Twice daily  Thank you, Reche Dixon, RN, BSN Diabetes Coordinator Inpatient Diabetes Program [phone] (team pager from 8a-5p)

## 2019-08-27 NOTE — Progress Notes (Signed)
Per RN pt not managing mastication of mechanical soft well this am, pocketing food. Will downgrade to puree.  Herbie Baltimore, [place]  [facility] Pager [phone] Office [phone]

## 2019-08-28 LAB — CBC
HCT: 37 % — ABNORMAL LOW (ref 39.0–52.0)
Hemoglobin: 12.7 g/dL — ABNORMAL LOW (ref 13.0–17.0)
MCH: 30.3 pg (ref 26.0–34.0)
MCHC: 34.3 g/dL (ref 30.0–36.0)
MCV: 88.3 fL (ref 80.0–100.0)
Platelets: 354 10*3/uL (ref 150–400)
RBC: 4.19 MIL/uL — ABNORMAL LOW (ref 4.22–5.81)
RDW: 14.4 % (ref 11.5–15.5)
WBC: 16.5 10*3/uL — ABNORMAL HIGH (ref 4.0–10.5)
nRBC: 0.1 % (ref 0.0–0.2)

## 2019-08-28 LAB — BASIC METABOLIC PANEL
Anion gap: 16 — ABNORMAL HIGH (ref 5–15)
BUN: 157 mg/dL — ABNORMAL HIGH (ref 8–23)
CO2: 18 mmol/L — ABNORMAL LOW (ref 22–32)
Calcium: 8.1 mg/dL — ABNORMAL LOW (ref 8.9–10.3)
Chloride: 103 mmol/L (ref 98–111)
Creatinine, Ser: 4.46 mg/dL — ABNORMAL HIGH (ref 0.61–1.24)
GFR calc Af Amer: 12 mL/min — ABNORMAL LOW (ref >60)
GFR calc non Af Amer: 10 mL/min — ABNORMAL LOW (ref >60)
Glucose, Bld: 159 mg/dL — ABNORMAL HIGH (ref 70–99)
Potassium: 3.8 mmol/L (ref 3.5–5.1)
Sodium: 137 mmol/L (ref 135–145)

## 2019-08-28 LAB — GLUCOSE, CAPILLARY
Glucose-Capillary: 135 mg/dL — ABNORMAL HIGH (ref 70–99)
Glucose-Capillary: 142 mg/dL — ABNORMAL HIGH (ref 70–99)
Glucose-Capillary: 126 mg/dL — ABNORMAL HIGH (ref 70–99)

## 2019-08-28 NOTE — Progress Notes (Signed)
PROGRESS NOTE  Nicolas Snyder KXF:[id number] DOB: Nov 13, 1920 DOA: 08/21/2019 PCP: Haywood Pao, MD   LOS: 7 days   Brief Narrative / Interim history: 86 year old male with hyperlipidemia, hypertension, DM 2, paroxysmal A. fib, hypothyroidism, CKD stage IV who [facility] with came into the hospital with COVID-19 pneumonia, weakness, lethargy.  He was initially febrile, tachycardic, tachypneic.  Hospital course was complicated by worsening renal function as well as a 2D echo showed new onset systolic heart failure.  Subjective / 24h Interval events: Remains confused, appears to be alert, he is getting cleaned.  No complaints for me  Assessment & Plan:  Principal Problem Acute Hypoxic Respiratory Failure due to Covid-19 Viral Illness -Patient has completed 5 days of remdesivir while hospitalized, was also placed and completed 5 days of cefepime -Hypoxia improving, on room air -Continue steroids, today day #8 -Continue supportive treatment   COVID-19 Labs  Recent Labs    08/26/19 0334  DDIMER 1.28*  FERRITIN 2,479*  CRP 5.6*   Active Problems Paroxysmal A. fib -Continue diltiazem for rate control, not a candidate for anticoagulation due to debility and high fall risk  Acute kidney injury on chronic kidney disease stage IV -Unclear baseline creatinine, he was in the 2 range about 9 years ago in 2012, now in the 4 range. -Continue to monitor urine output, urine is pink today suggesting mild hematuria.  Send urinalysis -Appears euvolemic -Creatinine continues to rise, as well as his BUN  Acute systolic CHF, EF 69-67%, troponin elevation due to demand ischemia versus non-STEMI -2D echo as below with wall motion abnormalities and depressed EF -High-sensitivity troponin elevated to 1500 on admission and trending down most recent 07/11/2011 on 2/14 -Possibly that he had a cardiac event prior to admission, suggesting very poor prognosis  Essential hypertension -Monitor,  continue diltiazem  Hypothyroidism -Continue Synthroid  Metabolic encephalopathy -Slightly more alert per prior notes, however with his renal function getting worse he may become more encephalopathic soon  Unspecified protein calorie malnutrition -Nutritional supplements as tolerated   Goals of care -d/w with son over the phone 2/17, updated him on the fact that he now has heart failure as well as progressive renal failure, and my concern that his prognosis is quite grim unless his creatinine improves.  He agrees with medical management, no dialysis, is aware that his father may be dying from this and wishes if things get turn for the worse for [place] to focus on comfort.  Scheduled Meds: . aspirin EC  81 mg Oral Daily  . Chlorhexidine Gluconate Cloth  6 each Topical Daily  . dexamethasone (DECADRON) injection  6 mg Intravenous Q24H  . diltiazem  360 mg Oral Daily  . docusate sodium  100 mg Oral Q12H  . feeding supplement (ENSURE ENLIVE)  237 mL Oral TID BM  . feeding supplement (PRO-STAT SUGAR FREE 64)  30 mL Oral BID  . fenofibrate  54 mg Oral Daily  . heparin  5,000 Units Subcutaneous Q8H  . insulin aspart  0-15 Units Subcutaneous TID WC  . insulin aspart  0-5 Units Subcutaneous QHS  . insulin detemir  8 Units Subcutaneous BID  . levothyroxine  75 mcg Oral Daily  . pantoprazole  40 mg Oral Daily  . pravastatin  10 mg Oral Daily  . tamsulosin  0.4 mg Oral QPC supper   Continuous Infusions:  PRN Meds:.ramelteon  DVT prophylaxis: heparin Code Status: DNR Family Communication: Discussed with son, Cadell [place]ri[place], [phone] Patient admitted from: SNF  Anticipated d/c place: To be determined Barriers to d/c: Progressive renal failure  Consultants:  None   Procedures:  2D echo:  1. Septal apical mid and apical inferior wall akinesis . Left ventricular  ejection fraction, by estimation, is 25 to 30%. The left ventricle has  severely decreased function. The left ventricle  demonstrates regional wall  motion abnormalities (see scoring diagram/findings for description). The left ventricular internal cavity size was moderately dilated. Left ventricular diastolic parameters are indeterminate.  2. Right ventricular systolic function is normal. The right ventricular  size is normal. There is moderately elevated pulmonary artery systolic  pressure.  3. Left atrial size was moderately dilated.  4. Right atrial size was moderately dilated.  5. The mitral valve is normal in structure and function. Moderate mitral  valve regurgitation. No evidence of mitral stenosis.  6. Tricuspid valve regurgitation is moderate.  7. The aortic valve is tricuspid. Aortic valve regurgitation is not  visualized. Mild to moderate aortic valve sclerosis/calcification is  present, without any evidence of aortic stenosis.   Microbiology: None   Antibacterials: Cefepime s/p 5 days    Objective: Vitals:   08/27/19 1931 08/28/19 0000 08/28/19 0500 08/28/19 0738  BP: 129/71  (!) 120/57 (!) 134/56  Pulse: 63 75 69 75  Resp: 15 16 18 20   Temp: 98.2 F (36.8 C)  98.5 F (36.9 C) (!) 96.8 F (36 C)  TempSrc: Axillary  Oral Axillary  SpO2: 100% 99% 100% 94%  Weight:   76 kg   Height:        Intake/Output Summary (Last 24 hours) at 08/28/2019 1024 Last data filed at 08/28/2019 0500 Gross per 24 hour  Intake 700 ml  Output 1400 ml  Net -700 ml   Filed Weights   08/24/19 0454 08/27/19 0500 08/28/19 0500  Weight: 65.4 kg 76.4 kg 76 kg    Examination:  Constitutional: No distress Eyes: No icterus ENMT: Moist external drains Neck: normal, supple Respiratory: Faint bibasilar crackles, no wheezing heard.  Normal respiratory effort Cardiovascular: Irregular, no murmurs, trace edema Abdomen: Soft, nontender, nondistended, bowel sounds positive Musculoskeletal: no clubbing / cyanosis.  Skin: No rashes Neurologic: Grossly nonfocal, does not follow commands  consistently  Data Reviewed: I have independently reviewed following labs and imaging studies   CBC: Recent Labs  Lab 08/22/19 0045 08/22/19 1243 08/23/19 0207 08/24/19 0331 08/25/19 0454 08/26/19 0334 08/28/19 0300  WBC 14.6*   < > 11.6* 11.5* 12.3* 13.2* 16.5*  NEUTROABS 13.9*  --  9.1* 10.7* 10.9* 11.5*  --   HGB 14.6   < > 13.3 13.3 12.5* 13.0 12.7*  HCT 42.8   < > 39.5 39.6 36.5* 37.9* 37.0*  MCV 91.3   < > 90.2 91.0 89.2 88.8 88.3  PLT 182   < > 202 240 277 289 354   < > = values in this interval not displayed.   Basic Metabolic Panel: Recent Labs  Lab 08/24/19 0331 08/25/19 0454 08/26/19 0334 08/27/19 0220 08/28/19 0300  NA 134* 139 137 136 137  K 3.4* 3.1* 3.2* 3.3* 3.8  CL 101 107 105 105 103  CO2 17* 17* 16* 17* 18*  GLUCOSE 370* 142* 115* 59* 159*  BUN 115* 129* 147* 150* 157*  CREATININE 3.45* 3.57* 4.01* 4.31* 4.46*  CALCIUM 7.5* 8.0* 8.1* 8.1* 8.1*   GFR: Estimated Creatinine Clearance: 10 mL/min (A) (by C-G formula based on SCr of 4.46 mg/dL (H)). Liver Function Tests: Recent Labs  Lab 08/22/19 0045 08/23/19  0207 08/24/19 0331 08/25/19 0454 08/26/19 0334  AST 49* 46* 35 28 26  ALT 25 30 28 24 26   ALKPHOS 33* 33* 39 34* 34*  BILITOT 1.3* 0.8 0.8 0.9 0.7  PROT 5.6* 5.2* 5.3* 5.4* 5.6*  ALBUMIN 2.4* 2.2* 2.4* 2.9* 2.8*   No results for input(s): LIPASE, AMYLASE in the last 168 hours. No results for input(s): AMMONIA in the last 168 hours. Coagulation Profile: No results for input(s): INR, PROTIME in the last 168 hours. Cardiac Enzymes: No results for input(s): CKTOTAL, CKMB, CKMBINDEX, TROPONINI in the last 168 hours. BNP (last 3 results) No results for input(s): PROBNP in the last 8760 hours. HbA1C: No results for input(s): HGBA1C in the last 72 hours. CBG: Recent Labs  Lab 08/27/19 0933 08/27/19 1122 08/27/19 1611 08/27/19 2058 08/28/19 0741  GLUCAP 88 115* 101* 156* 135*   Lipid Profile: No results for input(s): CHOL, HDL,  LDLCALC, TRIG, CHOLHDL, LDLDIRECT in the last 72 hours. Thyroid Function Tests: No results for input(s): TSH, T4TOTAL, FREET4, T3FREE, THYROIDAB in the last 72 hours. Anemia Panel: Recent Labs    08/26/19 0334  FERRITIN 2,479*   Urine analysis:    Component Value Date/Time   COLORURINE YELLOW 08/21/2019 2230   APPEARANCEUR HAZY (A) 08/21/2019 2230   LABSPEC 1.014 08/21/2019 2230   PHURINE 5.0 08/21/2019 2230   GLUCOSEU NEGATIVE 08/21/2019 2230   HGBUR SMALL (A) 08/21/2019 2230   BILIRUBINUR NEGATIVE 08/21/2019 2230   KETONESUR NEGATIVE 08/21/2019 2230   PROTEINUR 100 (A) 08/21/2019 2230   UROBILINOGEN 0.2 10/07/2010 1715   NITRITE NEGATIVE 08/21/2019 2230   LEUKOCYTESUR TRACE (A) 08/21/2019 2230   Sepsis Labs: Invalid input(s): PROCALCITONIN, LACTICIDVEN  Recent Results (from the past 240 hour(s))  Blood Culture (routine x 2)     Status: None   Collection Time: 08/21/19  4:45 PM   Specimen: BLOOD  Result Value Ref Range Status   Specimen Description BLOOD LEFT ANTECUBITAL  Final   Special Requests   Final    BOTTLES DRAWN AEROBIC AND ANAEROBIC Blood Culture adequate volume   Culture   Final    NO GROWTH 5 DAYS Performed at [facility], [address] [place], [place] [address]    Report Status 08/26/2019 FINAL  Final  Blood Culture (routine x 2)     Status: None   Collection Time: 08/21/19  6:00 PM   Specimen: BLOOD RIGHT ARM  Result Value Ref Range Status   Specimen Description BLOOD RIGHT ARM  Final   Special Requests   Final    BOTTLES DRAWN AEROBIC AND ANAEROBIC Blood Culture adequate volume   Culture   Final    NO GROWTH 5 DAYS Performed at [facility], [place] [address], [place], [place] [address]    Report Status 08/26/2019 FINAL  Final      Radiology Studies: DG CHEST PORT 1 VIEW  Result Date: 08/27/2019 CLINICAL DATA:  Hypoxemia EXAM: PORTABLE CHEST 1 VIEW COMPARISON:  08/25/2019 FINDINGS: Cardiac shadow is stable. Aortic calcifications  are again seen. Stable bilateral opacities are noted right greater than left consistent with the given clinical history. Old left humeral fracture with healing is seen. No acute bony abnormality is noted. IMPRESSION: Bilateral opacities consistent with the given clinical history of COVID-19 positivity. No new abnormality is seen. Electronically Signed   By: Inez Catalina M.D.   On: 08/27/2019 11:58   Marzetta Board, MD, PhD [facility]  Between 7 am - 7 pm  I am available, please contact me via Amion or Securechat  Between 7 pm - 7 am I am not available, please contact night coverage MD/APP via Amion

## 2019-08-28 NOTE — Progress Notes (Signed)
Notified MD regarding blood drainage at cath site with blood clots in foley drainage bag. Also penis edematous.  MD ordered to hold sq heparin. Will continue to monitor.

## 2019-08-28 NOTE — Progress Notes (Signed)
PT Cancellation Note  Patient Details Name: Nicolas Snyder MRN: [id number] DOB: 09/11/1920   Cancelled Treatment:    Reason Eval/Treat Not Completed: Patient's level of consciousness. Arrived to pt room to find staff assisting pt back to bed, he was extremely lethargic and unable to assist staff with stand pivot transfer back to bed. Staff used stedy lift to get him from recliner to bed. Will hold off on tx and f/u at next available time.  Horald Chestnut, PT    Delford Field 08/28/2019, 3:14 PM

## 2019-08-28 NOTE — NC FL2 (Addendum)
[place] LEVEL OF CARE SCREENING TOOL     IDENTIFICATION  Patient Name: Nicolas Snyder Birthdate: 02/26/1921 Sex: male Admission Date (Current Location): 08/21/2019  [facility] and [place] Number:  Herbalist and Address:  ([place])      Provider Number: [id number]  Attending Physician Name and Address:  Caren Griffins, MD  Relative Name and Phone Number:       Current Level of Care: Hospital Recommended Level of Care: [place] Prior Approval Number:    Date Approved/Denied:   PASRR Number: [id number]  Discharge Plan: SNF    Current Diagnoses: Patient Active Problem List   Diagnosis Date Noted  . CKD stage 4 due to type 2 diabetes mellitus ([place]) 08/25/2019  . Pneumonia due to COVID-19 virus 08/25/2019  . Severe sepsis ([place]) 08/22/2019  . Demand ischemia ([place]) 08/22/2019  . CKD (chronic kidney disease), stage III 08/22/2019  . HCAP (healthcare-associated pneumonia) 08/22/2019  . Chronic systolic (congestive) heart failure ([place]) 08/22/2019  . AKI (acute kidney injury) ([place]) 08/21/2019  . Hypokalemia 08/21/2019  . COVID-19 virus infection 08/21/2019  . Abnormal liver function 08/21/2019  . AF (atrial fibrillation) ([place])   . DM (diabetes mellitus) ([place])   . Hypertension   . Hyperlipemia   . Hypothyroidism     Orientation RESPIRATION BLADDER Height & Weight     Self  Normal Incontinent Weight: 167 lb 8.8 oz (76 kg) Height:  5\' 10"  (177.8 cm)  BEHAVIORAL SYMPTOMS/MOOD NEUROLOGICAL BOWEL NUTRITION STATUS      Incontinent Diet(See discharge order)  AMBULATORY STATUS COMMUNICATION OF NEEDS Skin   Extensive Assist Verbally Skin abrasions(open wound on left are with foam dressing, lift every shift to assess, change PRN)                       Personal Care Assistance Level of Assistance  Bathing, Feeding, Dressing Bathing Assistance: Maximum assistance Feeding assistance: Maximum assistance Dressing  Assistance: Maximum assistance     Functional Limitations Info             SPECIAL CARE FACTORS FREQUENCY  PT (By licensed PT), OT (By licensed OT)     PT Frequency: 5x a week OT Frequency: 5x a week            Contractures Contractures Info: Not present    Additional Factors Info  Code Status, Allergies, Insulin Sliding Scale, Isolation Precautions Code Status Info: DNR Allergies Info: Penicillin   Insulin Sliding Scale Info: Levemir 8 units 2x a day, Novolog 0-5 units daily at bedtime,  Novolog 0-15 units 3x a day with meals Isolation Precautions Info: airborne/ contact precautions for covid 19     Current Medications (08/28/2019):  This is the current hospital active medication list Current Facility-Administered Medications  Medication Dose Route Frequency Provider Last Rate Last Admin  . aspirin EC tablet 81 mg  81 mg Oral Daily Jani Gravel, MD   81 mg at 08/28/19 0831  . Chlorhexidine Gluconate Cloth 2 % PADS 6 each  6 each Topical Daily Annita Brod, MD   6 each at 08/28/19 [phone]  . dexamethasone (DECADRON) injection 6 mg  6 mg Intravenous Q24H Caren Griffins, MD   6 mg at 08/28/19 1139  . diltiazem (CARDIZEM CD) 24 hr capsule 360 mg  360 mg Oral Daily Jani Gravel, MD   360 mg at 08/28/19 0830  . docusate sodium (COLACE) capsule 100 mg  100  mg Oral Q12H Jani Gravel, MD   100 mg at 08/28/19 0830  . feeding supplement (ENSURE ENLIVE) (ENSURE ENLIVE) liquid 237 mL  237 mL Oral TID BM Annita Brod, MD   237 mL at 08/28/19 1204  . feeding supplement (PRO-STAT SUGAR FREE 64) liquid 30 mL  30 mL Oral BID Jani Gravel, MD   30 mL at 08/28/19 0830  . fenofibrate tablet 54 mg  54 mg Oral Daily Jani Gravel, MD   54 mg at 08/28/19 8938  . heparin injection 5,000 Units  5,000 Units Subcutaneous Lysle Dingwall, MD   5,000 Units at 08/28/19 1208  . insulin aspart (novoLOG) injection 0-15 Units  0-15 Units Subcutaneous TID WC Opyd, Ilene Qua, MD   2 Units at 08/28/19 1203  .  insulin aspart (novoLOG) injection 0-5 Units  0-5 Units Subcutaneous QHS Opyd, Ilene Qua, MD   3 Units at 08/25/19 2100  . insulin detemir (LEVEMIR) injection 8 Units  8 Units Subcutaneous BID Caren Griffins, MD   8 Units at 08/28/19 [phone]  . levothyroxine (SYNTHROID) tablet 75 mcg  75 mcg Oral Daily Jani Gravel, MD   75 mcg at 08/28/19 0530  . pantoprazole (PROTONIX) EC tablet 40 mg  40 mg Oral Daily Jani Gravel, MD   40 mg at 08/28/19 0831  . pravastatin (PRAVACHOL) tablet 10 mg  10 mg Oral Daily Jani Gravel, MD   10 mg at 08/27/19 2106  . ramelteon (ROZEREM) tablet 8 mg  8 mg Oral QHS PRN Opyd, Ilene Qua, MD   8 mg at 08/26/19 2115  . tamsulosin (FLOMAX) capsule 0.4 mg  0.4 mg Oral QPC supper Jani Gravel, MD   0.4 mg at 08/27/19 1743     Discharge Medications: Please see discharge summary for a list of discharge medications.  Relevant Imaging Results:  Relevant Lab Results:   Additional Information SSN [id number]  Emmerie Battaglia, [place]

## 2019-08-28 NOTE — Progress Notes (Signed)
   08/28/19 1200  Family/Significant Other Communication  Family/Significant Other Update Called;Updated  Notified Family to pick up pt wedding band. All questions and concerns addressed.

## 2019-08-28 NOTE — TOC Initial Note (Signed)
Transition of Care [facility]) - Initial/Assessment Note    Patient Details  Name: Nicolas Snyder MRN: [id number] Date of Birth: 1921-02-16  Transition of [facility] CM/SW Contact:    Kirstie Peri, [place] Work Phone Number: 08/28/2019, 12:47 PM  Clinical Narrative:                 MSW Intern contacted [facility] to touch base with facility about needs and discharge plans for the pt. The facility had concerns about pt not being at baseline and were concerned about the care he would need. Facility asked for documents and suggested rehab before being able to take pt back. Documents faxed to [phone]. Facility will follow up. MSW Intern spoke with Pt's son and daughter in law to discuss goals for after discharge placement. Pt's family is agreeable to SNF after discharge.They also have concerns about him returning to ALF at this time, so far off of his baseline. They said the plan is for him to return to his ALF, after SNF, where he has been for the last 3 years. They would like him to stay in [place], as close to the [address] area code, as the son is disabled. FL2 has been completed and pt faxed out. SW will continue to follow.  Expected Discharge Plan: Skilled Nursing Facility Barriers to Discharge: Continued Medical Work up   Patient Goals and CMS Choice Patient states their goals for this hospitalization and ongoing recovery are:: Patient family wants SNF before patient returns to ALF. CMS Medicare.gov Compare Post Acute Care list provided to:: Patient Represenative (must comment)(Adult Children) Choice offered to / list presented to : Adult Children  Expected Discharge Plan and Services Expected Discharge Plan: [place] In-house Referral: Clinical Social Work   Post Acute Care Choice: [place] Living arrangements for the past 2 months: [place]                                      Prior Living  Arrangements/Services Living arrangements for the past 2 months: [place] Lives with:: Facility Resident Patient language and need for interpreter reviewed:: Yes Do you feel safe going back to the place where you live?: Yes      Need for Family Participation in Patient Care: Yes (Comment) Care giver support system in place?: Yes (comment)   Criminal Activity/Legal Involvement Pertinent to Current Situation/Hospitalization: No - Comment as needed  Activities of Daily Living Home Assistive Devices/Equipment: Dentures (specify type), Electric scooter, Environmental consultant (specify type) ADL Screening (condition at time of admission) Patient's cognitive ability adequate to safely complete daily activities?: No Is the patient deaf or have difficulty hearing?: No Does the patient have difficulty seeing, even when wearing glasses/contacts?: No Does the patient have difficulty concentrating, remembering, or making decisions?: Yes Patient able to express need for assistance with ADLs?: Yes Does the patient have difficulty dressing or bathing?: No Independently performs ADLs?: Yes (appropriate for developmental age) Does the patient have difficulty walking or climbing stairs?: Yes Weakness of Legs: Both Weakness of Arms/Hands: Both  Permission Sought/Granted Permission sought to share information with : Facility Sport and exercise psychologist Permission granted to share information with : Yes, Verbal Permission Granted  Share Information with NAME: Maximo Spratling     Permission granted to share info w Relationship: Son  Permission granted to share info w Contact Information: [phone]  Emotional Assessment  Appearance:: Other (Comment Required(Unable to Assess) Attitude/Demeanor/Rapport: Unable to Assess Affect (typically observed): Unable to Assess Orientation: : Oriented to Self Alcohol / Substance Use: Not Applicable Psych Involvement: No (comment)  Admission diagnosis:  ARF (acute renal  failure) ([place]) [N17.9] Abnormal liver function [R94.5] Failure to thrive (0-17) [R62.51] AKI (acute kidney injury) ([place]) [N17.9] COVID-19 virus infection [U07.1] Pneumonia due to COVID-19 virus [U07.1, J12.82] Patient Active Problem List   Diagnosis Date Noted  . CKD stage 4 due to type 2 diabetes mellitus ([place]) 08/25/2019  . Pneumonia due to COVID-19 virus 08/25/2019  . Severe sepsis ([place]) 08/22/2019  . Demand ischemia ([place]) 08/22/2019  . CKD (chronic kidney disease), stage III 08/22/2019  . HCAP (healthcare-associated pneumonia) 08/22/2019  . Chronic systolic (congestive) heart failure ([place]) 08/22/2019  . AKI (acute kidney injury) ([place]) 08/21/2019  . Hypokalemia 08/21/2019  . COVID-19 virus infection 08/21/2019  . Abnormal liver function 08/21/2019  . AF (atrial fibrillation) ([place])   . DM (diabetes mellitus) ([place])   . Hypertension   . Hyperlipemia   . Hypothyroidism    PCP:  Tisovec, Fransico Him, MD Pharmacy:   [facility] [place] [address] Phone: [phone] Fax: [place], [place] - [address] [place] [address] Phone: [phone] Fax: [phone]     Social Determinants of Health (SDOH) Interventions    Readmission Risk Interventions No flowsheet data found.

## 2019-08-28 NOTE — Progress Notes (Signed)
Occupational Therapy Treatment Patient Details Name: Nicolas Snyder MRN: [id number] DOB: 02/26/1921 Today's Date: 08/28/2019    History of present illness Patient was admitted to the hospital with a working diagnosis of acute hypoxic respiratory failure due to SARS COVID-19 viral pneumonia complicated by acute on chronic kidney injury/to rule out bacterial sepsis. 86 y/o pt w/ past medical history for dyslipidemia, hypertension, type II times mellitus, paroxysmal atrial fibrillation, hypothyroidism and chronic kidney disease stage III.  He lives at a facility "[facility]".   OT comments  Patient has demonstrated slow progress with ADL skill level with overall level of assist at Minimal A to Total Assist level with extra time for PLB. Patient required Max verbal cues and increased processing time for sequencing of bed mobility and bed to recliner transfer with walker. Patient required assist x 2 for sit to stand transfer, cues for hand placement on walker. Patient only oriented to Self. Patient was able to complete grooming tasks and B UE AAROM exercises while sitting in recliner. Patient instructed on proper breathing sequence for flutter valve and incentive spirometer with average of 1018mL. O2 stats on room air with high 80s to 90s. Patient will benefit from continued skilled OT acute services.   Follow Up Recommendations  SNF    Equipment Recommendations  3 in 1 bedside commode    Recommendations for Other Services      Precautions / Restrictions Precautions Precautions: Fall;Other (comment) Restrictions Weight Bearing Restrictions: No       Mobility Bed Mobility Overal bed mobility: Needs Assistance Bed Mobility: Supine to Sit     Supine to sit: Max assist     General bed mobility comments: Max verbal cues on sequencing of OOB transfer  Transfers Overall transfer level: Needs assistance Equipment used: Rolling walker (2 wheeled)   Sit to Stand: Max assist              Balance   Sitting-balance support: Bilateral upper extremity supported Sitting balance-Leahy Scale: Fair       Standing balance-Leahy Scale: Poor                             ADL either performed or assessed with clinical judgement   ADL Overall ADL's : Needs assistance/impaired     Grooming: Wash/dry face;Oral care;Minimal assistance;Sitting           Upper Body Dressing : Moderate assistance   Lower Body Dressing: Total assistance Lower Body Dressing Details (indicate cue type and reason): donning of B LE grip socks                     Vision       Perception     Praxis      Cognition Arousal/Alertness: Lethargic Behavior During Therapy: Archivist)                                            Exercises General Exercises - Upper Extremity Shoulder Flexion: AAROM Shoulder Extension: AAROM Shoulder ABduction: AAROM Shoulder ADduction: AAROM Shoulder Horizontal ABduction: AAROM Shoulder Horizontal ADduction: AAROM Elbow Flexion: AAROM Elbow Extension: AAROM Other Exercises Other Exercises: (flutter valve x 6 reps) Other Exercises: (incentive spirometer 1079mL for 7 reps )   Shoulder Instructions       General Comments      Pertinent Vitals/  Pain       Pain Assessment: No/denies pain  Home Living                                          Prior Functioning/Environment              Frequency           Progress Toward Goals  OT Goals(current goals can now be found in the care plan section)  Progress towards OT goals: Progressing toward goals     Plan Discharge plan remains appropriate    Co-evaluation                 AM-PAC OT "6 Clicks" Daily Activity     Outcome Measure   Help from another person eating meals?: A Lot Help from another person taking care of personal grooming?: A Lot Help from another person toileting, which includes using toliet, bedpan, or urinal?:  Total Help from another person bathing (including washing, rinsing, drying)?: A Lot Help from another person to put on and taking off regular upper body clothing?: A Lot Help from another person to put on and taking off regular lower body clothing?: Total 6 Click Score: 10    End of Session Equipment Utilized During Treatment: Rolling walker;Gait belt      Activity Tolerance Patient limited by lethargy   Patient Left in chair;with call bell/phone within reach;with chair alarm set   Nurse Communication Mobility status        Time: 0930-1001 OT Time Calculation (min): 31 min  Charges: OT General Charges $OT Visit: 1 Visit OT Treatments $Self Care/Home Management : 8-22 mins $Therapeutic Exercise: 8-22 mins  Conrad Zajkowski OTR/L    Mkayla Steele 08/28/2019, 1:13 PM

## 2019-08-28 NOTE — Plan of Care (Signed)
Pt lethargic oriented to self only. Pt edematous bilateral upper excr and low excr. Pt on RA. BP soft throughout the day. During morning assessed MD made aware of mucus from rectum also bloody urine with sediment in foley bag. Was order to hold sq heparin and urinalysis. Pt pulled out his foley at 1700, notified MD ordered foley to remain out and complete bladder scans periodically. Pt participated in physical therapy, pt sat in chair. Will continue to monitor.    Problem: Education: Goal: Knowledge of risk factors and measures for prevention of condition will improve Outcome: Progressing   Problem: Coping: Goal: Psychosocial and spiritual needs will be supported Outcome: Progressing   Problem: Respiratory: Goal: Will maintain a patent airway Outcome: Progressing Goal: Complications related to the disease process, condition or treatment will be avoided or minimized Outcome: Progressing

## 2019-08-29 LAB — BASIC METABOLIC PANEL
Anion gap: 14 (ref 5–15)
BUN: 157 mg/dL — ABNORMAL HIGH (ref 8–23)
CO2: 20 mmol/L — ABNORMAL LOW (ref 22–32)
Calcium: 8.1 mg/dL — ABNORMAL LOW (ref 8.9–10.3)
Chloride: 107 mmol/L (ref 98–111)
Creatinine, Ser: 4.87 mg/dL — ABNORMAL HIGH (ref 0.61–1.24)
GFR calc Af Amer: 11 mL/min — ABNORMAL LOW (ref >60)
GFR calc non Af Amer: 9 mL/min — ABNORMAL LOW (ref >60)
Glucose, Bld: 164 mg/dL — ABNORMAL HIGH (ref 70–99)
Potassium: 3.8 mmol/L (ref 3.5–5.1)
Sodium: 141 mmol/L (ref 135–145)

## 2019-08-29 LAB — GLUCOSE, CAPILLARY
Glucose-Capillary: 140 mg/dL — ABNORMAL HIGH (ref 70–99)
Glucose-Capillary: 100 mg/dL — ABNORMAL HIGH (ref 70–99)
Glucose-Capillary: 153 mg/dL — ABNORMAL HIGH (ref 70–99)
Glucose-Capillary: 156 mg/dL — ABNORMAL HIGH (ref 70–99)
Glucose-Capillary: 196 mg/dL — ABNORMAL HIGH (ref 70–99)
Glucose-Capillary: 223 mg/dL — ABNORMAL HIGH (ref 70–99)

## 2019-08-29 NOTE — Progress Notes (Signed)
   08/29/19 1100  Family/Significant Other Communication  Family/Significant Other Update Called;Updated  All questions and concerns addressed.

## 2019-08-29 NOTE — Plan of Care (Signed)
Pt oriented to self only. Able to follow commands. Pt was able to sit in chair pt transfer via stedy lift. Pt incontinent and urinating pink attempted external cath however, pt would remove. Notified MD of bladder scan amount and will continue scan. Dressing changed c/d/I. Will continue to monitor.   Problem: Education: Goal: Knowledge of risk factors and measures for prevention of condition will improve Outcome: Progressing   Problem: Coping: Goal: Psychosocial and spiritual needs will be supported Outcome: Progressing   Problem: Respiratory: Goal: Will maintain a patent airway Outcome: Progressing Goal: Complications related to the disease process, condition or treatment will be avoided or minimized Outcome: Progressing

## 2019-08-29 NOTE — Progress Notes (Signed)
PROGRESS NOTE  Nicolas Snyder UEA:[id number] DOB: 1920-09-23 DOA: 08/21/2019 PCP: Haywood Pao, MD   LOS: 8 days   Brief Narrative / Interim history: 86 year old male with hyperlipidemia, hypertension, DM 2, paroxysmal A. fib, hypothyroidism, CKD stage IV who lives [facility] with came into the hospital with COVID-19 pneumonia, weakness, lethargy.  He was initially febrile, tachycardic, tachypneic.  Hospital course was complicated by worsening renal function as well as a 2D echo showed new onset systolic heart failure.  Subjective / 24h Interval events: Remains confused, appears to be alert, he is getting cleaned.  No complaints for me  Assessment & Plan:  Principal Problem Acute Hypoxic Respiratory Failure due to Covid-19 Viral Illness -Patient has completed 5 days of remdesivir while hospitalized, was also placed and completed 5 days of cefepime -Hypoxia improving, on room air -Continue steroids, today day #9 -Continue supportive treatment   COVID-19 Labs  No results for input(s): DDIMER, FERRITIN, LDH, CRP in the last 72 hours. Active Problems Paroxysmal A. fib -Continue diltiazem for rate control, not a candidate for anticoagulation due to debility and high fall risk  Acute kidney injury on chronic kidney disease stage IV -Unclear baseline creatinine, he was in the 2 range about 9 years ago in 2012, now in the 4 range and continues to get worse -Continue to monitor urine output, urine was pink on 2/18 and heparin prophylaxis has been held.  Patient self removed Foley, will leave out for now, monitor urine output -Appears euvolemic -Unfortunately his creatinine continues to rise along with a BUN.  If it continues will be a matter of time before he will become uremic  Acute systolic CHF, EF 47-82%, troponin elevation due to demand ischemia versus non-STEMI -2D echo as below with wall motion abnormalities and depressed EF -High-sensitivity troponin elevated to 1500 on  admission and trending down most recent 07/11/2011 on 2/14 -Possibly that he had a cardiac event prior to admission, suggesting very poor prognosis  Essential hypertension -Monitor, continue diltiazem  Hypothyroidism -Continue Synthroid  Metabolic encephalopathy -He is more alert, has underlying dementia  Unspecified protein calorie malnutrition -Nutritional supplements as tolerated   Goals of care -d/w with son over the phone, updated him on the fact that he now has heart failure as well as progressive renal failure, and my concern that his prognosis is quite grim unless his creatinine improves.  He agrees with medical management, no dialysis, is aware that his father may be dying from this and wishes if things get turn for the worse for [place] to focus on comfort -I have also consulted palliative care.  Scheduled Meds: . aspirin EC  81 mg Oral Daily  . Chlorhexidine Gluconate Cloth  6 each Topical Daily  . dexamethasone (DECADRON) injection  6 mg Intravenous Q24H  . diltiazem  360 mg Oral Daily  . docusate sodium  100 mg Oral Q12H  . feeding supplement (ENSURE ENLIVE)  237 mL Oral TID BM  . feeding supplement (PRO-STAT SUGAR FREE 64)  30 mL Oral BID  . fenofibrate  54 mg Oral Daily  . insulin aspart  0-15 Units Subcutaneous TID WC  . insulin aspart  0-5 Units Subcutaneous QHS  . insulin detemir  8 Units Subcutaneous BID  . levothyroxine  75 mcg Oral Daily  . pantoprazole  40 mg Oral Daily  . pravastatin  10 mg Oral Daily  . tamsulosin  0.4 mg Oral QPC supper   Continuous Infusions:  PRN Meds:.ramelteon  DVT prophylaxis: heparin  Code Status: DNR Family Communication: Discussed with son, Jon Lall, [phone] Patient admitted from: SNF Anticipated d/c place: To be determined Barriers to d/c: Progressive renal failure  Consultants:  None   Procedures:  2D echo:  1. Septal apical mid and apical inferior wall akinesis . Left ventricular  ejection fraction, by  estimation, is 25 to 30%. The left ventricle has  severely decreased function. The left ventricle demonstrates regional wall  motion abnormalities (see scoring diagram/findings for description). The left ventricular internal cavity size was moderately dilated. Left ventricular diastolic parameters are indeterminate.  2. Right ventricular systolic function is normal. The right ventricular  size is normal. There is moderately elevated pulmonary artery systolic  pressure.  3. Left atrial size was moderately dilated.  4. Right atrial size was moderately dilated.  5. The mitral valve is normal in structure and function. Moderate mitral  valve regurgitation. No evidence of mitral stenosis.  6. Tricuspid valve regurgitation is moderate.  7. The aortic valve is tricuspid. Aortic valve regurgitation is not  visualized. Mild to moderate aortic valve sclerosis/calcification is  present, without any evidence of aortic stenosis.   Microbiology: None   Antibacterials: Cefepime s/p 5 days    Objective: Vitals:   08/28/19 1958 08/29/19 0400 08/29/19 0424 08/29/19 0755  BP: 92/81 135/65  140/82  Pulse: 80 78  83  Resp: 17 16    Temp: 98.1 F (36.7 C) 98.4 F (36.9 C)  98.4 F (36.9 C)  TempSrc: Axillary Axillary  Axillary  SpO2: 100% 98%  99%  Weight:   76.7 kg   Height:        Intake/Output Summary (Last 24 hours) at 08/29/2019 1135 Last data filed at 08/28/2019 2000 Gross per 24 hour  Intake 300 ml  Output 400 ml  Net -100 ml   Filed Weights   08/27/19 0500 08/28/19 0500 08/29/19 0424  Weight: 76.4 kg 76 kg 76.7 kg    Examination:  Constitutional: No distress Eyes: No scleral icterus ENMT: Moist mucous membranes Neck: normal, supple Respiratory: Faint bibasilar crackles, no wheezing, normal respiratory effort Cardiovascular: Irregular, no murmurs heard, trace edema Abdomen: Soft, nontender, bowel sounds positive Musculoskeletal: no clubbing / cyanosis.  Skin: No  rashes Neurologic: No focal deficits  Data Reviewed: I have independently reviewed following labs and imaging studies   CBC: Recent Labs  Lab 08/23/19 0207 08/24/19 0331 08/25/19 0454 08/26/19 0334 08/28/19 0300  WBC 11.6* 11.5* 12.3* 13.2* 16.5*  NEUTROABS 9.1* 10.7* 10.9* 11.5*  --   HGB 13.3 13.3 12.5* 13.0 12.7*  HCT 39.5 39.6 36.5* 37.9* 37.0*  MCV 90.2 91.0 89.2 88.8 88.3  PLT 202 240 277 289 016   Basic Metabolic Panel: Recent Labs  Lab 08/25/19 0454 08/26/19 0334 08/27/19 0220 08/28/19 0300 08/29/19 0125  NA 139 137 136 137 141  K 3.1* 3.2* 3.3* 3.8 3.8  CL 107 105 105 103 107  CO2 17* 16* 17* 18* 20*  GLUCOSE 142* 115* 59* 159* 164*  BUN 129* 147* 150* 157* 157*  CREATININE 3.57* 4.01* 4.31* 4.46* 4.87*  CALCIUM 8.0* 8.1* 8.1* 8.1* 8.1*   GFR: Estimated Creatinine Clearance: 9.2 mL/min (A) (by C-G formula based on SCr of 4.87 mg/dL (H)). Liver Function Tests: Recent Labs  Lab 08/23/19 0207 08/24/19 0331 08/25/19 0454 08/26/19 0334  AST 46* 35 28 26  ALT 30 28 24 26   ALKPHOS 33* 39 34* 34*  BILITOT 0.8 0.8 0.9 0.7  PROT 5.2* 5.3* 5.4* 5.6*  ALBUMIN  2.2* 2.4* 2.9* 2.8*   No results for input(s): LIPASE, AMYLASE in the last 168 hours. No results for input(s): AMMONIA in the last 168 hours. Coagulation Profile: No results for input(s): INR, PROTIME in the last 168 hours. Cardiac Enzymes: No results for input(s): CKTOTAL, CKMB, CKMBINDEX, TROPONINI in the last 168 hours. BNP (last 3 results) No results for input(s): PROBNP in the last 8760 hours. HbA1C: No results for input(s): HGBA1C in the last 72 hours. CBG: Recent Labs  Lab 08/28/19 0741 08/28/19 1202 08/28/19 1603 08/28/19 1956 08/29/19 0752  GLUCAP 135* 140* 142* 126* 100*   Lipid Profile: No results for input(s): CHOL, HDL, LDLCALC, TRIG, CHOLHDL, LDLDIRECT in the last 72 hours. Thyroid Function Tests: No results for input(s): TSH, T4TOTAL, FREET4, T3FREE, THYROIDAB in the last 72  hours. Anemia Panel: No results for input(s): VITAMINB12, FOLATE, FERRITIN, TIBC, IRON, RETICCTPCT in the last 72 hours. Urine analysis:    Component Value Date/Time   COLORURINE YELLOW 08/21/2019 2230   APPEARANCEUR HAZY (A) 08/21/2019 2230   LABSPEC 1.014 08/21/2019 2230   PHURINE 5.0 08/21/2019 2230   GLUCOSEU NEGATIVE 08/21/2019 2230   HGBUR SMALL (A) 08/21/2019 2230   BILIRUBINUR NEGATIVE 08/21/2019 2230   KETONESUR NEGATIVE 08/21/2019 2230   PROTEINUR 100 (A) 08/21/2019 2230   UROBILINOGEN 0.2 10/07/2010 1715   NITRITE NEGATIVE 08/21/2019 2230   LEUKOCYTESUR TRACE (A) 08/21/2019 2230   Sepsis Labs: Invalid input(s): PROCALCITONIN, LACTICIDVEN  Recent Results (from the past 240 hour(s))  Blood Culture (routine x 2)     Status: None   Collection Time: 08/21/19  4:45 PM   Specimen: BLOOD  Result Value Ref Range Status   Specimen Description BLOOD LEFT ANTECUBITAL  Final   Special Requests   Final    BOTTLES DRAWN AEROBIC AND ANAEROBIC Blood Culture adequate volume   Culture   Final    NO GROWTH 5 DAYS Performed at [facility], [address] [place], [place] [address]    Report Status 08/26/2019 FINAL  Final  Blood Culture (routine x 2)     Status: None   Collection Time: 08/21/19  6:00 PM   Specimen: BLOOD RIGHT ARM  Result Value Ref Range Status   Specimen Description BLOOD RIGHT ARM  Final   Special Requests   Final    BOTTLES DRAWN AEROBIC AND ANAEROBIC Blood Culture adequate volume   Culture   Final    NO GROWTH 5 DAYS Performed at [facility], [place] [address], [place], [place] [address]    Report Status 08/26/2019 FINAL  Final      Radiology Studies: DG CHEST PORT 1 VIEW  Result Date: 08/27/2019 CLINICAL DATA:  Hypoxemia EXAM: PORTABLE CHEST 1 VIEW COMPARISON:  08/25/2019 FINDINGS: Cardiac shadow is stable. Aortic calcifications are again seen. Stable bilateral opacities are noted right greater than left consistent with the given clinical  history. Old left humeral fracture with healing is seen. No acute bony abnormality is noted. IMPRESSION: Bilateral opacities consistent with the given clinical history of COVID-19 positivity. No new abnormality is seen. Electronically Signed   By: Inez Catalina M.D.   On: 08/27/2019 11:58   Marzetta Board, MD, PhD [facility]  Between 7 am - 7 pm I am available, please contact me via Amion or Securechat  Between 7 pm - 7 am I am not available, please contact night coverage MD/APP via Amion

## 2019-08-29 NOTE — Progress Notes (Signed)
Physical Therapy Treatment Patient Details Name: Nicolas Snyder MRN: [id number] DOB: May 06, 1921 Today's Date: 08/29/2019    History of Present Illness Patient was admitted to the hospital with a working diagnosis of acute hypoxic respiratory failure due to SARS COVID-19 viral pneumonia complicated by acute on chronic kidney injury/to rule out bacterial sepsis. 86 y/o pt w/ past medical history for dyslipidemia, hypertension, type II times mellitus, paroxysmal atrial fibrillation, hypothyroidism and chronic kidney disease stage III.  He lives at a facility "[facility]".    PT Comments    Pt is now needing max assist and max cues and encouragement to complete most tasks. This session was able to stand from recliner with max a and RW, took two attempts to get up, once standing was also able to take few steps from recliner to bed with max assist and cues. Once in bed was not able to give any effort for repositioning. Functionally pt is not progressing with mobility, at times he does well with tx and others he is so lethargic needing lift to complete transfers. Will continue to follow acutely and attempt to advance as he tolerates.    Follow Up Recommendations  SNF     Equipment Recommendations  None recommended by PT    Recommendations for Other Services       Precautions / Restrictions Precautions Precautions: Fall;Other (comment) Restrictions Weight Bearing Restrictions: No    Mobility  Bed Mobility Overal bed mobility: Needs Assistance Bed Mobility: Sit to Supine     Supine to sit: Max assist        Transfers Overall transfer level: Needs assistance Equipment used: Rolling walker (2 wheeled) Transfers: Sit to/from Stand Sit to Stand: Max assist         General transfer comment: Pt sitting in chair needed max a and x2 attempt to gte up but able to get up to stance  Ambulation/Gait Ambulation/Gait assistance: Max assist Gait Distance (Feet): 5 Feet Assistive device:  Rolling walker (2 wheeled) Gait Pattern/deviations: Wide base of support;Shuffle Gait velocity: vrey slow   General Gait Details: Pt able to take few steps from recliner to chair, but does need cueing and guidance to get there   Stairs             Wheelchair Mobility    Modified Rankin (Stroke Patients Only)       Balance Overall balance assessment: Needs assistance Sitting-balance support: Feet supported;Bilateral upper extremity supported Sitting balance-Leahy Scale: Fair     Standing balance support: Bilateral upper extremity supported;During functional activity Standing balance-Leahy Scale: Poor                              Cognition Arousal/Alertness: Lethargic Behavior During Therapy: Flat affect Overall Cognitive Status: No family/caregiver present to determine baseline cognitive functioning                                 General Comments: pt barely keeping eyes open during session needing stern cues and encouragement to complete tasks given      Exercises      General Comments        Pertinent Vitals/Pain Pain Assessment: Faces Faces Pain Scale: Hurts little more Pain Location: w/ gen mobility  Pain Descriptors / Indicators: Grimacing Pain Intervention(s): Monitored during session;Limited activity within patient's tolerance    Home Living  Prior Function            PT Goals (current goals can now be found in the care plan section) Acute Rehab PT Goals Patient Stated Goal: did not voice goals PT Goal Formulation: Patient unable to participate in goal setting Time For Goal Achievement: 09/09/19 Potential to Achieve Goals: Fair Progress towards PT goals: Not progressing toward goals - comment    Frequency    Min 2X/week      PT Plan Current plan remains appropriate    Co-evaluation              AM-PAC PT "6 Clicks" Mobility   Outcome Measure  Help needed turning from  your back to your side while in a flat bed without using bedrails?: A Lot Help needed moving from lying on your back to sitting on the side of a flat bed without using bedrails?: A Lot Help needed moving to and from a bed to a chair (including a wheelchair)?: A Lot Help needed standing up from a chair using your arms (e.g., wheelchair or bedside chair)?: A Lot Help needed to walk in hospital room?: A Lot Help needed climbing 3-5 steps with a railing? : Total 6 Click Score: 11    End of Session Equipment Utilized During Treatment: Gait belt Activity Tolerance: Patient limited by fatigue;Patient limited by lethargy;Treatment limited secondary to medical complications (Comment) Patient left: in bed;with call bell/phone within reach;with bed alarm set   PT Visit Diagnosis: Unsteadiness on feet (R26.81);Other abnormalities of gait and mobility (R26.89)     Time: 6811-5726 PT Time Calculation (min) (ACUTE ONLY): 11 min  Charges:  $Therapeutic Activity: 8-22 mins                     Horald Chestnut, PT    Delford Field 08/29/2019, 4:30 PM

## 2019-08-29 NOTE — Progress Notes (Signed)
Notified MD regard Bladder scan amount of 34ml. MD Costin ordered to scan pt again in the next 2-3 hours. Pt is incontinent has several soiled brief. Will continue to monitor.

## 2019-08-29 NOTE — Progress Notes (Signed)
Nutrition Follow-up RD working remotely.  DOCUMENTATION CODES:   Not applicable  INTERVENTION:    Continue Ensure Enlive po TID, each supplement provides 350 kcal and 20 grams of protein.   Continue dysphagia 1 diet per SLP.   Continue Hormel Shake daily with Breakfast which provides 520 kcals and 22 g of protein and Magic cup BID with lunch and dinner, each supplement provides 290 kcal and 9 grams of protein, automatically on meal trays to optimize nutritional intake.   NUTRITION DIAGNOSIS:   Increased nutrient needs related to acute illness, catabolic illness(COVID) as evidenced by estimated needs.  GOAL:   Patient will meet greater than or equal to 90% of their needs  MONITOR:   PO intake, Supplement acceptance, Skin  REASON FOR ASSESSMENT:   Consult Assessment of nutrition requirement/status  ASSESSMENT:   86 yo male admitted from SNF with severe sepsis d/t PNA and COVID. PMH includes A fib, DM, HTN, HLD, hypothyroidism, GERD.  S/P bedside swallow evaluation with SLP.  2/12 Diet changed to Dysphagia 2 with thin liquids. 2/17 Upgraded to dysphagia 3 with thin liquids, but had trouble chewing, so changed to dysphagia 1 with thin liquids.  Patient has been consuming 30-90% of meals since diet changed to dysphagia 1 on 2/17. Average meal completion is 44% for the past 2 days. He is also drinking Ensure Enlive supplements 2-3 times per day.   Labs reviewed. CBG's: [phone]  Medications reviewed and include decadron, colace, novolog, flomax.  Weight up to 76.7 kg from 65.4 kg on admission.  I/O +4.5 L since admission.  NUTRITION - FOCUSED PHYSICAL EXAM:  unable to complete  Diet Order:   Diet Order            DIET - DYS 1 Room service appropriate? Yes; Fluid consistency: Thin  Diet effective now              EDUCATION NEEDS:   Not appropriate for education at this time  Skin:  Skin Assessment: Reviewed RN Assessment(skin tear to arm)  Last  BM:  2/15  Height:   Ht Readings from Last 1 Encounters:  08/22/19 5\' 10"  (1.778 m)    Weight:   Wt Readings from Last 1 Encounters:  08/29/19 76.7 kg    Ideal Body Weight:  75.5 kg  BMI:  Body mass index is 24.26 kg/m.  Estimated Nutritional Needs:   Kcal:  1900-2100  Protein:  105-120 gm  Fluid:  >/= 1.9 L    Molli Barrows, RD, LDN, CNSC Please refer to Amion for contact information.

## 2019-08-29 NOTE — Progress Notes (Signed)
Occupational Therapy Treatment Patient Details Name: Nicolas Snyder MRN: [id number] DOB: 1920-08-13 Today's Date: 08/29/2019    History of present illness Patient was admitted to the hospital with a working diagnosis of acute hypoxic respiratory failure due to SARS COVID-19 viral pneumonia complicated by acute on chronic kidney injury/to rule out bacterial sepsis. 86 y/o pt w/ past medical history for dyslipidemia, hypertension, type II times mellitus, paroxysmal atrial fibrillation, hypothyroidism and chronic kidney disease stage III.  He lives at a facility "[facility]".   OT comments  Patient is making slow progress toward OT goals secondary to increased level of fatigue and confusion. Patient was able to assist with UE bathing, but required total assist with lower body bathing in bed. O2 stats on room air at 89% and above throughout OT tx session. Oriented to self only. Patient's level of assistance with transfers has declined to requiring use of Denna Haggard for bed >recliner transfer. Patient was not able to execute sit to stand transfer without max Assistance from staff x 2. Patient was able to complete grooming in bedside chair with Minimal-Moderate assistance. Patient will benefit from continued skilled acute OT services.   Follow Up Recommendations  SNF    Equipment Recommendations  3 in 1 bedside commode    Recommendations for Other Services      Precautions / Restrictions Precautions Precautions: Fall;Other (comment)       Mobility Bed Mobility Overal bed mobility: Needs Assistance Bed Mobility: Supine to Sit     Supine to sit: Max assist     General bed mobility comments: Cues to open eyes throughout OT tx session, cues to initiate sequencing movements  Transfers Overall transfer level: Needs assistance Equipment used: Rolling walker (2 wheeled) Transfers: Sit to/from Stand Sit to Stand: Max assist;+2 physical assistance         General transfer comment: Patient  is declining in level of assistance for transfers. Decline from RW to need for [facility] equipment to transfer from bed to recliner.     Balance Overall balance assessment: Needs assistance Sitting-balance support: Bilateral upper extremity supported Sitting balance-Leahy Scale: Fair     Standing balance support: Bilateral upper extremity supported Standing balance-Leahy Scale: Poor                             ADL either performed or assessed with clinical judgement   ADL       Grooming: Wash/dry face;Oral care;Minimal assistance;Sitting   Upper Body Bathing: Maximal assistance   Lower Body Bathing: Total assistance   Upper Body Dressing : Moderate assistance   Lower Body Dressing: Total assistance                       Vision       Perception     Praxis      Cognition Arousal/Alertness: Lethargic Behavior During Therapy: Restless                                            Exercises General Exercises - Upper Extremity Shoulder Flexion: AAROM Shoulder Extension: AAROM Shoulder Horizontal ABduction: AAROM Shoulder Horizontal ADduction: AAROM Elbow Flexion: AAROM Other Exercises Other Exercises: flutter valve with hand held assistance, max verbal cues to initiate exhale Other Exercises: incentive spirometer with max verbal cues to initiate inhale (563mL)  Shoulder Instructions       General Comments      Pertinent Vitals/ Pain       Pain Assessment: Faces Pain Score: 2  Faces Pain Scale: Hurts a little bit Pain Location: abdomen Pain Descriptors / Indicators: Cramping Pain Intervention(s): Limited activity within patient's tolerance  Home Living                                          Prior Functioning/Environment              Frequency           Progress Toward Goals  OT Goals(current goals can now be found in the care plan section)  Progress towards OT goals: Progressing toward  goals     Plan Discharge plan remains appropriate    Co-evaluation                 AM-PAC OT "6 Clicks" Daily Activity     Outcome Measure   Help from another person eating meals?: A Lot Help from another person taking care of personal grooming?: A Lot Help from another person toileting, which includes using toliet, bedpan, or urinal?: Total Help from another person bathing (including washing, rinsing, drying)?: A Lot Help from another person to put on and taking off regular upper body clothing?: A Lot Help from another person to put on and taking off regular lower body clothing?: Total 6 Click Score: 10    End of Session Equipment Utilized During Treatment: Gait belt      Activity Tolerance Patient limited by lethargy   Patient Left in chair;with call bell/phone within reach;with chair alarm set   Nurse Communication Mobility status        Time: [phone] OT Time Calculation (min): 41 min  Charges: OT General Charges $OT Visit: 1 Visit OT Treatments $Self Care/Home Management : 23-37 mins $Therapeutic Exercise: 8-22 mins  Adian Jablonowski OTR/L    Supriya Beaston 08/29/2019, 9:32 AM

## 2019-08-30 DIAGNOSIS — J1282 Pneumonia due to coronavirus disease 2019: Secondary | ICD-10-CM

## 2019-08-30 DIAGNOSIS — U071 COVID-19: Principal | ICD-10-CM

## 2019-08-30 DIAGNOSIS — Z515 Encounter for palliative care: Secondary | ICD-10-CM

## 2019-08-30 DIAGNOSIS — Z7189 Other specified counseling: Secondary | ICD-10-CM

## 2019-08-30 DIAGNOSIS — R531 Weakness: Secondary | ICD-10-CM

## 2019-08-30 LAB — BASIC METABOLIC PANEL
Anion gap: 17 — ABNORMAL HIGH (ref 5–15)
BUN: 171 mg/dL — ABNORMAL HIGH (ref 8–23)
CO2: 17 mmol/L — ABNORMAL LOW (ref 22–32)
Calcium: 7.9 mg/dL — ABNORMAL LOW (ref 8.9–10.3)
Chloride: 110 mmol/L (ref 98–111)
Creatinine, Ser: 4.89 mg/dL — ABNORMAL HIGH (ref 0.61–1.24)
GFR calc Af Amer: 11 mL/min — ABNORMAL LOW (ref >60)
GFR calc non Af Amer: 9 mL/min — ABNORMAL LOW (ref >60)
Glucose, Bld: 170 mg/dL — ABNORMAL HIGH (ref 70–99)
Potassium: 4 mmol/L (ref 3.5–5.1)
Sodium: 144 mmol/L (ref 135–145)

## 2019-08-30 LAB — GLUCOSE, CAPILLARY
Glucose-Capillary: 158 mg/dL — ABNORMAL HIGH (ref 70–99)
Glucose-Capillary: 289 mg/dL — ABNORMAL HIGH (ref 70–99)

## 2019-08-30 NOTE — Progress Notes (Signed)
   08/30/19 1500  Family/Significant Other Communication  Family/Significant Other Update Called;Visiting  Answered questions and concerns addressed

## 2019-08-30 NOTE — Progress Notes (Signed)
PROGRESS NOTE  Nicolas Snyder UXL:[id number] DOB: January 11, 1921 DOA: 08/21/2019 PCP: Haywood Pao, MD   LOS: 9 days   Brief Narrative / Interim history: 86 year old male with hyperlipidemia, hypertension, DM 2, paroxysmal A. fib, hypothyroidism, CKD stage IV who [facility] came into the hospital with COVID-19 pneumonia, weakness, lethargy.  He was initially febrile, tachycardic, tachypneic.  Hospital course was complicated by worsening renal function as well as a 2D echo showed new onset systolic heart failure.  Subjective / 24h Interval events: More lethargic today, opens eyes intermittently  Assessment & Plan:  Principal Problem Acute Hypoxic Respiratory Failure due to Covid-19 Viral Illness -Patient has completed 5 days of remdesivir while hospitalized, was also placed and completed 5 days of cefepime -He is no longer hypoxic and on room air -Continue steroids, today day # 10 -Continue supportive treatment   COVID-19 Labs  No results for input(s): DDIMER, FERRITIN, LDH, CRP in the last 72 hours. Active Problems Paroxysmal A. fib -Continue diltiazem for rate control, not a candidate for anticoagulation due to debility and high fall risk  Acute kidney injury on chronic kidney disease stage IV -Unclear baseline creatinine, he was in the 2 range about 9 years ago in 2012, now in the 4 range and continues to get worse -Continue to monitor urine output, urine was pink on 2/18 and heparin prophylaxis has been held.  Patient self removed Foley, will leave out for now, monitor urine output -Appears euvolemic -Unfortunately his creatinine continues to rise along with a BUN, and now he seems to be a bit more lethargic, very poor prognosis  Acute systolic CHF, EF 53-66%, troponin elevation due to demand ischemia versus non-STEMI -2D echo as below with wall motion abnormalities and depressed EF -High-sensitivity troponin elevated to 1500 on admission and trending down most recent  07/11/2011 on 2/14 -Possibly that he had a cardiac event prior to admission  Essential hypertension -Monitor, continue diltiazem  Hypothyroidism -Continue Synthroid  Metabolic encephalopathy -He is more alert, has underlying dementia  Unspecified protein calorie malnutrition -Nutritional supplements as tolerated   Goals of care -Discussed again with the son today over the phone, also along with palliative consult, he has not progressed well in the last week despite the treatment, and given new onset progressive renal failure along with new heart failure, failure to thrive, poor p.o. intake, progressive lethargy, he is best served at residential hospice.  Family in agreement.  I have contacted Education officer, museum.   Scheduled Meds: . aspirin EC  81 mg Oral Daily  . Chlorhexidine Gluconate Cloth  6 each Topical Daily  . diltiazem  360 mg Oral Daily  . docusate sodium  100 mg Oral Q12H  . feeding supplement (ENSURE ENLIVE)  237 mL Oral TID BM  . feeding supplement (PRO-STAT SUGAR FREE 64)  30 mL Oral BID  . fenofibrate  54 mg Oral Daily  . insulin aspart  0-15 Units Subcutaneous TID WC  . insulin aspart  0-5 Units Subcutaneous QHS  . insulin detemir  8 Units Subcutaneous BID  . levothyroxine  75 mcg Oral Daily  . pantoprazole  40 mg Oral Daily  . pravastatin  10 mg Oral Daily  . tamsulosin  0.4 mg Oral QPC supper   Continuous Infusions:  PRN Meds:.ramelteon  DVT prophylaxis: heparin Code Status: DNR Family Communication: Discussed with son, Jordyn Doane, [phone] Patient admitted from: SNF Anticipated d/c place: residential hospice Barriers to d/c: Progressive renal failure  Consultants:  None  Procedures:  2D echo:  1. Septal apical mid and apical inferior wall akinesis . Left ventricular  ejection fraction, by estimation, is 25 to 30%. The left ventricle has  severely decreased function. The left ventricle demonstrates regional wall  motion abnormalities (see scoring  diagram/findings for description). The left ventricular internal cavity size was moderately dilated. Left ventricular diastolic parameters are indeterminate.  2. Right ventricular systolic function is normal. The right ventricular  size is normal. There is moderately elevated pulmonary artery systolic  pressure.  3. Left atrial size was moderately dilated.  4. Right atrial size was moderately dilated.  5. The mitral valve is normal in structure and function. Moderate mitral  valve regurgitation. No evidence of mitral stenosis.  6. Tricuspid valve regurgitation is moderate.  7. The aortic valve is tricuspid. Aortic valve regurgitation is not  visualized. Mild to moderate aortic valve sclerosis/calcification is  present, without any evidence of aortic stenosis.   Microbiology: None   Antibacterials: Cefepime s/p 5 days    Objective: Vitals:   08/30/19 0434 08/30/19 0500 08/30/19 0811 08/30/19 1135  BP: (!) 116/57  (!) 150/79 (!) 141/67  Pulse: 81  87 86  Resp: 18     Temp: 97.8 F (36.6 C)  98.6 F (37 C) 98 F (36.7 C)  TempSrc: Axillary  Axillary Axillary  SpO2: 99%  99% 100%  Weight:  76.9 kg    Height:        Intake/Output Summary (Last 24 hours) at 08/30/2019 1252 Last data filed at 08/30/2019 0630 Gross per 24 hour  Intake --  Output 425 ml  Net -425 ml   Filed Weights   08/28/19 0500 08/29/19 0424 08/30/19 0500  Weight: 76 kg 76.7 kg 76.9 kg    Examination:  Constitutional: No distress, lethargic Eyes: No scleral icterus ENMT: Moist mucous membranes Neck: normal, supple Respiratory: Shallow respirations, no wheezing Cardiovascular: Irregular, no murmurs Abdomen: Soft, NT, ND, bowel sounds positive Musculoskeletal: no clubbing / cyanosis.  Skin: No rashes Neurologic: No focal deficits  Data Reviewed: I have independently reviewed following labs and imaging studies   CBC: Recent Labs  Lab 08/24/19 0331 08/25/19 0454 08/26/19 0334  08/28/19 0300  WBC 11.5* 12.3* 13.2* 16.5*  NEUTROABS 10.7* 10.9* 11.5*  --   HGB 13.3 12.5* 13.0 12.7*  HCT 39.6 36.5* 37.9* 37.0*  MCV 91.0 89.2 88.8 88.3  PLT 240 277 289 924   Basic Metabolic Panel: Recent Labs  Lab 08/26/19 0334 08/27/19 0220 08/28/19 0300 08/29/19 0125 08/30/19 0233  NA 137 136 137 141 144  K 3.2* 3.3* 3.8 3.8 4.0  CL 105 105 103 107 110  CO2 16* 17* 18* 20* 17*  GLUCOSE 115* 59* 159* 164* 170*  BUN 147* 150* 157* 157* 171*  CREATININE 4.01* 4.31* 4.46* 4.87* 4.89*  CALCIUM 8.1* 8.1* 8.1* 8.1* 7.9*   GFR: Estimated Creatinine Clearance: 9.1 mL/min (A) (by C-G formula based on SCr of 4.89 mg/dL (H)). Liver Function Tests: Recent Labs  Lab 08/24/19 0331 08/25/19 0454 08/26/19 0334  AST 35 28 26  ALT 28 24 26   ALKPHOS 39 34* 34*  BILITOT 0.8 0.9 0.7  PROT 5.3* 5.4* 5.6*  ALBUMIN 2.4* 2.9* 2.8*   No results for input(s): LIPASE, AMYLASE in the last 168 hours. No results for input(s): AMMONIA in the last 168 hours. Coagulation Profile: No results for input(s): INR, PROTIME in the last 168 hours. Cardiac Enzymes: No results for input(s): CKTOTAL, CKMB, CKMBINDEX, TROPONINI in the  last 168 hours. BNP (last 3 results) No results for input(s): PROBNP in the last 8760 hours. HbA1C: No results for input(s): HGBA1C in the last 72 hours. CBG: Recent Labs  Lab 08/29/19 1124 08/29/19 1241 08/29/19 1612 08/29/19 2015 08/30/19 0732  GLUCAP 153* 156* 196* 223* 158*   Lipid Profile: No results for input(s): CHOL, HDL, LDLCALC, TRIG, CHOLHDL, LDLDIRECT in the last 72 hours. Thyroid Function Tests: No results for input(s): TSH, T4TOTAL, FREET4, T3FREE, THYROIDAB in the last 72 hours. Anemia Panel: No results for input(s): VITAMINB12, FOLATE, FERRITIN, TIBC, IRON, RETICCTPCT in the last 72 hours. Urine analysis:    Component Value Date/Time   COLORURINE YELLOW 08/21/2019 2230   APPEARANCEUR HAZY (A) 08/21/2019 2230   LABSPEC 1.014 08/21/2019  2230   PHURINE 5.0 08/21/2019 2230   GLUCOSEU NEGATIVE 08/21/2019 2230   HGBUR SMALL (A) 08/21/2019 2230   BILIRUBINUR NEGATIVE 08/21/2019 2230   KETONESUR NEGATIVE 08/21/2019 2230   PROTEINUR 100 (A) 08/21/2019 2230   UROBILINOGEN 0.2 10/07/2010 1715   NITRITE NEGATIVE 08/21/2019 2230   LEUKOCYTESUR TRACE (A) 08/21/2019 2230   Sepsis Labs: Invalid input(s): PROCALCITONIN, LACTICIDVEN  Recent Results (from the past 240 hour(s))  Blood Culture (routine x 2)     Status: None   Collection Time: 08/21/19  4:45 PM   Specimen: BLOOD  Result Value Ref Range Status   Specimen Description BLOOD LEFT ANTECUBITAL  Final   Special Requests   Final    BOTTLES DRAWN AEROBIC AND ANAEROBIC Blood Culture adequate volume   Culture   Final    NO GROWTH 5 DAYS Performed at [facility], [address] [place], [place] [address]    Report Status 08/26/2019 FINAL  Final  Blood Culture (routine x 2)     Status: None   Collection Time: 08/21/19  6:00 PM   Specimen: BLOOD RIGHT ARM  Result Value Ref Range Status   Specimen Description BLOOD RIGHT ARM  Final   Special Requests   Final    BOTTLES DRAWN AEROBIC AND ANAEROBIC Blood Culture adequate volume   Culture   Final    NO GROWTH 5 DAYS Performed at [facility], [place] [address], [place], [place] [address]    Report Status 08/26/2019 FINAL  Final      Radiology Studies: No results found. Marzetta Board, MD, PhD [facility]  Between 7 am - 7 pm I am available, please contact me via Amion or Securechat  Between 7 pm - 7 am I am not available, please contact night coverage MD/APP via Amion

## 2019-08-30 NOTE — TOC Progression Note (Signed)
Transition of Care [facility]) - Progression Note    Patient Details  Name: TIAN MCMURTREY MRN: [id number] Date of Birth: 1921/05/04  Transition of Care [facility]) CM/SW [place],  Phone Number: 08/30/2019, 2:29 PM  Clinical Narrative:   CSW alerted by MD that plan has now changed to residential hospice after discussion with palliative. CSW spoke with patient's son, Magda Paganini, about hospice options and that there was not an option in [place] due to the patient's COVID diagnosis. Magda Paganini indicated understanding. CSW presented choice and Magda Paganini and his wife chose Hospice in [place], as [place] would probably be closer for them to get to. CSW contacted Debbie at the hospice home to provide referral. Awaiting information on bed availability. CSW to follow.    Expected Discharge Plan: [place] Barriers to Discharge: Hospice Bed not available  Expected Discharge Plan and Services Expected Discharge Plan: [place] In-house Referral: Clinical Social Work   Post Acute Care Choice: [facility] Living arrangements for the past 2 months: [place]                                       Social Determinants of Health (SDOH) Interventions    Readmission Risk Interventions No flowsheet data found.

## 2019-08-30 NOTE — Consult Note (Signed)
Consultation Note Date: 08/30/2019   Patient Name: Nicolas Snyder  DOB: 06/18/1921  MRN: [id number]  Age / Sex: 86 y.o., male  PCP: Snyder, Nicolas Him, MD Referring Physician: Caren Griffins, MD  Reason for Consultation: Establishing goals of care  HPI/Patient Profile: 86 y.o. male  with past medical history of   admitted on 08/21/2019 with  .  86 year old male with hyperlipidemia, hypertension, DM 2, paroxysmal A. fib, hypothyroidism, CKD stage IV who [facility] came into the hospital with COVID-19 pneumonia, weakness, lethargy.  He was initially febrile, tachycardic, tachypneic.  Hospital course was complicated by worsening renal function as well as a 2D echo showed new onset systolic heart failure.  PMT consult for ongoing goals of care discussions, particularly for assessing appropriate disposition options has been requested.    Clinical Assessment and Goals of Care: Chart reviewed thoroughly, discussed with TRH MD.   Palliative medicine is specialized medical care for people living with serious illness. It focuses on providing relief from the symptoms and stress of a serious illness. The goal is to improve quality of life for both the patient and the family.  Goals of care: Broad aims of medical therapy in relation to the patient's values and preferences. Our aim is to provide medical care aimed at enabling patients to achieve the goals that matter most to them, given the circumstances of their particular medical situation and their constraints.   See below.    NEXT OF KIN  son   SUMMARY OF RECOMMENDATIONS    Agree with DNR Agree with current mode of care Recommend residential hospice for prioritization of symptom management and comfort care towards the end of this hospitalization. Nicolas Snyder is a 86 yo gentleman with worsening renal function, new heart failure, recent COVID. Functional  status and PO status waxes and wanes. Patient likely not safe to go back to ALF, patient likely will not benefit from a SNF rehab attempt in my opinion. Would recommend comfort measures and residential hospice environment instead. Prognosis could be as short as 2 weeks at this stage in my opinion.   Thank you for the consult.   Code Status/Advance Care Planning:  DNR    Symptom Management:    as above   Palliative Prophylaxis:   Delirium Protocol  Psycho-social/Spiritual:   Desire for further Chaplaincy support:yes  Additional Recommendations: Education on Hospice  Prognosis:   < 2 weeks?  Discharge Planning: Hospice facility      Primary Diagnoses: Present on Admission: . AKI (acute kidney injury) ([place]) . Hypokalemia . Hypertension . Hyperlipemia . Hypothyroidism . AF (atrial fibrillation) ([place]) . COVID-19 virus infection . Severe sepsis ([place]) . Chronic systolic (congestive) heart failure ([facility])   I have reviewed the medical record, interviewed the patient and family, and examined the patient. The following aspects are pertinent.  Past Medical History:  Diagnosis Date  . AF (atrial fibrillation) ([place])   . DM (diabetes mellitus) ([place])   . GERD (gastroesophageal reflux disease)   . Hyperlipemia   .  Hypertension   . Hypothyroidism   . Renal disorder    renal lithiasis   Social History   Socioeconomic History  . Marital status: Married    Spouse name: Nicolas Snyder  . Number of children: 1  . Years of education: Not on file  . Highest education level: Not on file  Occupational History  . 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 Smoking status: Former Research scientist (life sciences)  . Smokeless tobacco: Never Used  Substance and Sexual Activity  . Alcohol use: No  . Drug use: No  . Sexual activity: Not on file  Other Topics Concern  . Not on file  Social History Narrative  . Not on file   Social Determinants of Health   Financial  Resource Strain:   . Difficulty of Paying Living Expenses: Not on file  Food Insecurity:   . Worried About Charity fundraiser in the Last Year: Not on file  . Ran Out of Food in the Last Year: Not on file  Transportation Needs:   . Lack of Transportation (Medical): Not on file  . Lack of Transportation (Non-Medical): Not on file  Physical Activity:   . Days of Exercise per Week: Not on file  . Minutes of Exercise per Session: Not on file  Stress:   . Feeling of Stress : Not on file  Social Connections:   . Frequency of Communication with Friends and Family: Not on file  . Frequency of Social Gatherings with Friends and Family: Not on file  . Attends Religious Services: Not on file  . Active Member of Clubs or Organizations: Not on file  . Attends Archivist Meetings: Not on file  . Marital Status: Not on file   Family History  Problem Relation Age of Onset  . Stroke Father        DECEASED  . Cancer Mother        DECEASED  . Heart attack Brother        DECEASED  . Coronary artery disease Brother 73       CABG  . Alzheimer's disease Sister        DECEASED   Scheduled Meds: . aspirin EC  81 mg Oral Daily  . Chlorhexidine Gluconate Cloth  6 each Topical Daily  . diltiazem  360 mg Oral Daily  . docusate sodium  100 mg Oral Q12H  . feeding supplement (ENSURE ENLIVE)  237 mL Oral TID BM  . feeding supplement (PRO-STAT SUGAR FREE 64)  30 mL Oral BID  . fenofibrate  54 mg Oral Daily  . insulin aspart  0-15 Units Subcutaneous TID WC  . insulin aspart  0-5 Units Subcutaneous QHS  . insulin detemir  8 Units Subcutaneous BID  . levothyroxine  75 mcg Oral Daily  . pantoprazole  40 mg Oral Daily  . pravastatin  10 mg Oral Daily  . tamsulosin  0.4 mg Oral QPC supper   Continuous Infusions: PRN Meds:.ramelteon Medications Prior to Admission:  Prior to Admission medications   Medication Sig Start Date End Date Taking? Authorizing Provider  aspirin 81 MG tablet Take 81  mg by mouth daily.   Yes [provider]  calcium carbonate (TUMS - DOSED IN MG ELEMENTAL CALCIUM) 500 MG chewable tablet Chew 1 tablet by mouth at bedtime.    Yes [provider]  Cholecalciferol (VITAMIN D) 2000 UNITS tablet Take 2,000 Units by mouth daily.   Yes [provider]  fenofibrate (TRICOR) 48 MG tablet Take 48 mg by mouth daily.     Yes [provider]  glipiZIDE (GLUCOTROL) 10 MG tablet Take 10 mg by mouth daily. 08/13/19  Yes [provider]  levothyroxine (SYNTHROID, LEVOTHROID) 75 MCG tablet Take 75 mcg by mouth daily.     Yes [provider]  losartan-hydrochlorothiazide (HYZAAR) 100-25 MG per tablet Take 1 tablet by mouth daily.     Yes [provider]  pravastatin (PRAVACHOL) 10 MG tablet Take 10 mg by mouth daily.     Yes [provider]  Tamsulosin HCl (FLOMAX) 0.4 MG CAPS Take 0.4 mg by mouth daily after supper.    Yes [provider]  diltiazem (CARDIZEM CD) 360 MG 24 hr capsule Take 360 mg by mouth daily.    [provider]  esomeprazole (NEXIUM) 20 MG capsule Take 20 mg by mouth daily at 12 noon.    [provider]  sodium-potassium bicarbonate (ALKA-SELTZER GOLD) TBEF dissolvable tablet Take 1 tablet by mouth daily as needed (for indigestion).    [provider]   Allergies  Allergen Reactions  . Penicillins Hives, Swelling and Other (See Comments)    Eye swelling Did it involve swelling of the face/tongue/throat, SOB, or low BP? Unknown Did it involve sudden or severe rash/hives, skin peeling, or any reaction on the inside of your mouth or nose? Unknown Did you need to seek medical attention at a hospital or doctor's office? Unknown When did it last happen?"Many years ago" If all above answers are "NO", may proceed with cephalosporin use.    Review of Systems  Physical Exam The above conversation was completed via telephone due to the visitor restrictions  during the COVID-19 pandemic. Thorough chart review and discussion with necessary members of the care team was completed as part of assessment. All issues were discussed and addressed but no physical exam was performed.  Vital Signs: BP (!) 141/67 (BP Location: Right Arm)   Pulse 86   Temp 98 F (36.7 C) (Axillary)   Resp 18   Ht 5\' 10"  (1.778 m)   Wt 76.9 kg   SpO2 100%   BMI 24.33 kg/m  Pain Scale: PAINAD   Pain Score: 0-No pain   SpO2: SpO2: 100 % O2 Device:SpO2: 100 % O2 Flow Rate: .O2 Flow Rate (L/min): 4 L/min  IO: Intake/output summary:   Intake/Output Summary (Last 24 hours) at 08/30/2019 1212 Last data filed at 08/30/2019 0630 Gross per 24 hour  Intake --  Output 425 ml  Net -425 ml    LBM: Last BM Date: 08/25/19 Baseline Weight: Weight: 65.4 kg Most recent weight: Weight: 76.9 kg     Palliative Assessment/Data:   PPS 30%  Time In:  12 Time Out:  1300 Time Total:   60  Greater than 50%  of this time was spent counseling and coordinating care related to the above assessment and plan.  Signed by: Loistine Chance, MD   Please contact Palliative Medicine Team phone at [phone] for questions and concerns.  For individual provider: See Shea Evans

## 2019-08-31 LAB — GLUCOSE, CAPILLARY
Glucose-Capillary: 99 mg/dL (ref 70–99)
Glucose-Capillary: 155 mg/dL — ABNORMAL HIGH (ref 70–99)
Glucose-Capillary: 113 mg/dL — ABNORMAL HIGH (ref 70–99)
Glucose-Capillary: 173 mg/dL — ABNORMAL HIGH (ref 70–99)

## 2019-08-31 MED ORDER — SCOPOLAMINE 1 MG/3DAYS TD PT72
1.0000 | MEDICATED_PATCH | TRANSDERMAL | Status: DC
  Administered 2019-08-31: 1.5 mg via TRANSDERMAL
  Filled 2019-08-31: qty 1

## 2019-08-31 MED ORDER — LORAZEPAM 2 MG/ML IJ SOLN: 0.5000 mg | Freq: Four times a day (QID) | INTRAMUSCULAR | Status: DC | PRN

## 2019-08-31 MED ORDER — FENTANYL CITRATE (PF) 100 MCG/2ML IJ SOLN: 12.5000 ug | INTRAMUSCULAR | Status: DC | PRN

## 2019-08-31 NOTE — Progress Notes (Signed)
   08/31/19 1100  Family/Significant Other Communication  Family/Significant Other Update Updated  Family came to hospital updated while given pt ring.

## 2019-08-31 NOTE — Progress Notes (Signed)
Physical Therapy Treatment Patient Details Name: Nicolas Snyder MRN: [id number] DOB: September 07, 1920 Today's Date: 08/31/2019    History of Present Illness Patient was admitted to the hospital with a working diagnosis of acute hypoxic respiratory failure due to SARS COVID-19 viral pneumonia complicated by acute on chronic kidney injury/to rule out bacterial sepsis. 86 y/o pt w/ past medical history for dyslipidemia, hypertension, type II times mellitus, paroxysmal atrial fibrillation, hypothyroidism and chronic kidney disease stage III.  He lives at a facility "[facility]".    PT Comments    Pt was very lethargic today, but agreed to participating with therapy. He requiring maximum VC and TC to maintain focus, but was able to sit EOB >15 min with 1 UE support without LOB. He required MaxA to standing from sitting on bed and was able to maintain for approximately 30 seconds performing lateral weight shifts with modA from clinician for movement and to control hips (keep from sitting back down). Pt was able to perform exercise EOB without loosing balance, mostly AAROM due to limited participation secondary to lethargy and attention deficits today. Plan to continue following acutely with PT to maintain strength for transfers when transitioning to hospice care to reduce caregiver burden.   Follow Up Recommendations  SNF     Equipment Recommendations  None recommended by PT    Recommendations for Other Services       Precautions / Restrictions Precautions Precautions: Fall;Other (comment) Precaution Comments: Airborne (COVID), Impulsive (from prior sessions) Restrictions Weight Bearing Restrictions: No    Mobility  Bed Mobility Overal bed mobility: Needs Assistance Bed Mobility: Sit to Supine     Supine to sit: Max assist Sit to supine: Max assist   General bed mobility comments: Pt require multiple TC/VCs for concentration and participation in session. Able to sit EOB modI with RUE primary  support but able to shift weight to other UE safely.  Transfers Overall transfer level: Needs assistance Equipment used: Rolling walker (2 wheeled) Transfers: Sit to/from Stand Sit to Stand: Max assist         General transfer comment: Pt able to assist with standing and remain standing, requiring MaxA to assit with initial stand. Able to maintain approximately 30 seconds before sitting back down. Would not attempt to transfer to chair without stedy at this time.  Ambulation/Gait                 Stairs             Wheelchair Mobility    Modified Rankin (Stroke Patients Only)       Balance Overall balance assessment: Needs assistance Sitting-balance support: Feet supported;Bilateral upper extremity supported Sitting balance-Leahy Scale: Fair Sitting balance - Comments: Pt able to maintain balance on EOB for majority of session >15 min while performing EOB LE exercises with mod-maxA for participation. Postural control: Right lateral lean Standing balance support: Bilateral upper extremity supported;During functional activity Standing balance-Leahy Scale: Poor Standing balance comment: some retropulsion noted with standing                            Cognition Arousal/Alertness: Lethargic Behavior During Therapy: Flat affect Overall Cognitive Status: No family/caregiver present to determine baseline cognitive functioning                                 General Comments: pt barely keeping eyes open during session  needing stern cues and encouragement to complete tasks given      Exercises General Exercises - Lower Extremity Long Arc Quad: AAROM;10 reps;Both;Seated Hip Flexion/Marching: AAROM;10 reps;Seated    General Comments General comments (skin integrity, edema, etc.): Pt had no c/o of dizziness today, vitals WNL. Pt required min-modA to maintain standing when reached, sat within 30 seconds.       Pertinent Vitals/Pain Pain  Assessment: No/denies pain    Home Living                      Prior Function            PT Goals (current goals can now be found in the care plan section) Acute Rehab PT Goals Patient Stated Goal: did not voice goals PT Goal Formulation: Patient unable to participate in goal setting Time For Goal Achievement: 09/09/19 Potential to Achieve Goals: Fair Progress towards PT goals: Progressing toward goals    Frequency    Min 2X/week      PT Plan Current plan remains appropriate    Co-evaluation              AM-PAC PT "6 Clicks" Mobility   Outcome Measure  Help needed turning from your back to your side while in a flat bed without using bedrails?: A Lot Help needed moving from lying on your back to sitting on the side of a flat bed without using bedrails?: A Lot Help needed moving to and from a bed to a chair (including a wheelchair)?: A Lot Help needed standing up from a chair using your arms (e.g., wheelchair or bedside chair)?: A Lot Help needed to walk in hospital room?: A Lot Help needed climbing 3-5 steps with a railing? : Total 6 Click Score: 11    End of Session Equipment Utilized During Treatment: Gait belt Activity Tolerance: Patient limited by lethargy;Patient limited by fatigue Patient left: in bed;with call bell/phone within reach;with bed alarm set Nurse Communication: Mobility status PT Visit Diagnosis: Unsteadiness on feet (R26.81);Other abnormalities of gait and mobility (R26.89)     Time: 3710-6269 PT Time Calculation (min) (ACUTE ONLY): 16 min  Charges:  $Therapeutic Activity: 8-22 mins                     Ann Held PT, DPT Acute Rehab [facility] Penning [facility] P: [phone]    Renato Gails 08/31/2019, 9:43 AM

## 2019-08-31 NOTE — Discharge Summary (Addendum)
Physician Discharge Summary  Nicolas Snyder OJJ:[id number] DOB: 02-02-1921 DOA: 08/21/2019  PCP: Haywood Pao, MD  Admit date: 08/21/2019 Discharge date: 09/01/2019  Admitted From: ALF Disposition:  Residential hospice  Recommendations for Outpatient Follow-up:  1. To be discharged to residential [place]: none Equipment/Devices: none  Discharge Condition: guarded CODE STATUS: DNR Diet recommendation: comfort feeding  HPI: Per admitting MD,  Nicolas Snyder  is a 86 y.o. male,  w gerd, hypertension, hyperlipidemia, Dm2, CKD stage 3 (baseline creatinine 2), Pafib, Hypothyroidism, DNR, covid-19 positive, apparently presents with c/o poor appetite, There was concerns for dehydration at [facility] and therefore sent to ED for evaluation. In ED,  T 101.8 P 103, R 27, Bp 142/99 pox 95% on RA CXR IMPRESSION: Indistinct opacity throughout the right mid and lower lung suspicious for COVID-19 pneumonia in this setting. No pleural effusion.  Na 133, K 3.1, Bun 93, Creatinine 4.13 Ast 47 , Alt 27, Alk phos 30, T. Bili 1.8 Alb 2.4 Wbc 1.5, Hgb 14.6, Plt 175 D dimer 1.92 Procalcitonin 2.06 LDH 280 Tg 290 Ferritin 3,218 Crp 24 Lactic acid 1.9  Blood culture x2 pending Urinalysis pending  Pt started on ns at 79m per hour  ED requests admission for ARF, Covid -[place]/ Discharge diagnoses: Principal Problem Acute Hypoxic Respiratory Failure due to Covid-19 Viral Illness -Patient has completed 5 days of remdesivir while hospitalized, was also placed and completed 5 days of cefepime, his hypoxia resolved and he has remained stable on room air.  Active problems: Goals of care -despite improvement from his Covid pneumonia, patient has had progressive decline during his hospital stay with acute kidney injury on chronic kidney disease, also new onset of systolic CHF, mental status decline, and failure to thrive with poor p.o. intake.  Given lack of improvement, after discussion  with the family and palliative care patient is most appropriate to be discharged to residential hospice.  Family in agreement  Paroxysmal A. Fib -Continue diltiazem for rate control, not a candidate for anticoagulation due to debility and high fall risk Acute kidney injury on chronic kidney disease stage IV -Unclear baseline creatinine, he was in the 2 range about 9 years ago in 2012, now in the 4 range and continues to get worse. Acute systolic CHF, EF 293-71% troponin elevation due to demand ischemia versus non-STEMI -2D echo as below with wall motion abnormalities and depressed EF, high-sensitivity troponin elevated to 1500 on admission and trending down most recent 07/11/2011 on 2/14. Possibly that he had a cardiac event prior to admission Essential hypertension Hypothyroidism -Continue Synthroid Metabolic encephalopathy  Unspecified protein calorie malnutrition -comfort feeding   Discharge Instructions  Allergies as of 09/01/2019      Reactions   Penicillins Hives, Swelling, Other (See Comments)   Eye swelling Did it involve swelling of the face/tongue/throat, SOB, or low BP? Unknown Did it involve sudden or severe rash/hives, skin peeling, or any reaction on the inside of your mouth or nose? Unknown Did you need to seek medical attention at a hospital or doctor's office? Unknown When did it last happen?"Many years ago" If all above answers are "NO", may proceed with cephalosporin use.      Medication List    STOP taking these medications   calcium carbonate 500 MG chewable tablet Commonly known as: TUMS - dosed in mg elemental calcium   fenofibrate 48 MG tablet Commonly known as: TRICOR   losartan-hydrochlorothiazide 100-25 MG tablet Commonly known as:  HYZAAR   pravastatin 10 MG tablet Commonly known as: PRAVACHOL   sodium-potassium bicarbonate Tbef dissolvable tablet Commonly known as: ALKA-SELTZER GOLD     TAKE these medications   aspirin 81 MG tablet Take 81 mg by  mouth daily.   diltiazem 360 MG 24 hr capsule Commonly known as: CARDIZEM CD Take 360 mg by mouth daily.   esomeprazole 20 MG capsule Commonly known as: NEXIUM Take 20 mg by mouth daily at 12 noon.   Flomax 0.4 MG Caps capsule Generic drug: tamsulosin Take 0.4 mg by mouth daily after supper.   glipiZIDE 10 MG tablet Commonly known as: GLUCOTROL Take 10 mg by mouth daily.   levothyroxine 75 MCG tablet Commonly known as: SYNTHROID Take 75 mcg by mouth daily.   Vitamin D 50 MCG (2000 UT) tablet Take 2,000 Units by mouth daily.      Consultations:  Palliative care  Procedures/Studies:  2D echo  1. Septal apical mid and apical inferior wall akinesis . Left ventricular  ejection fraction, by estimation, is 25 to 30%. The left ventricle has  severely decreased function. The left ventricle demonstrates regional wall  motion abnormalities (see scoring diagram/findings for description). The left ventricular internal cavity size was moderately dilated. Left ventricular diastolic parameters are indeterminate.  2. Right ventricular systolic function is normal. The right ventricular  size is normal. There is moderately elevated pulmonary artery systolic  pressure.  3. Left atrial size was moderately dilated.  4. Right atrial size was moderately dilated.  5. The mitral valve is normal in structure and function. Moderate mitral  valve regurgitation. No evidence of mitral stenosis.  6. Tricuspid valve regurgitation is moderate.  7. The aortic valve is tricuspid. Aortic valve regurgitation is not  visualized. Mild to moderate aortic valve sclerosis/calcification is  present, without any evidence of aortic stenosis.    DG Chest 1 View  Result Date: 08/25/2019 CLINICAL DATA:  Dyspnea.  COVID-19 pneumonia. EXAM: CHEST  1 VIEW COMPARISON:  08/21/2019 FINDINGS: Patient rotated right. Mild cardiomegaly. No definite pleural fluid. No pneumothorax. Worsened interstitial opacities  throughout the right lung with relative sparing of the right lung base. Developing vague left lower lobe pulmonary opacity. IMPRESSION: Progressive right and suspicion of developing left lower lobe multifocal COVID-19 pneumonia. Electronically Signed   By: Abigail Miyamoto M.D.   On: 08/25/2019 14:18   US Abdomen Complete  Result Date: 08/21/2019 CLINICAL DATA:  Acute renal failure, abnormal liver function tests, COVID-19 positive EXAM: ABDOMEN ULTRASOUND COMPLETE COMPARISON:  10/05/2010 FINDINGS: Gallbladder: No gallstones or wall thickening visualized. No sonographic Murphy sign noted by sonographer. Common bile duct: Diameter: 4 mm Liver: No focal lesion identified. Within normal limits in parenchymal echogenicity. Portal vein is patent on color Doppler imaging with normal direction of blood flow towards the liver. IVC: No abnormality visualized. Pancreas: Obscured due to bowel gas. Spleen: Size and appearance within normal limits. Right Kidney: Length: 9.5. There is right renal cortical thinning. Echotexture is normal. Multiple peripelvic and cortical cysts are identified, largest in the lower pole measuring 2.8 cm. Left Kidney: Length: 8.7. There is left renal cortical thinning. Echotexture is normal. No masses. Abdominal aorta: The infrarenal abdominal aorta measures 3.3 cm in maximal anterior-posterior dimension in the sagittal plane. Other findings: No free fluid. Incidental small right pleural effusion. IMPRESSION: 1. Bilateral renal cortical atrophy. 2. 3.3 cm infrarenal abdominal aortic aneurysm. 3. Trace right pleural effusion. Electronically Signed   By: Randa Ngo M.D.   On: 08/21/2019 22:24  DG CHEST PORT 1 VIEW  Result Date: 08/27/2019 CLINICAL DATA:  Hypoxemia EXAM: PORTABLE CHEST 1 VIEW COMPARISON:  08/25/2019 FINDINGS: Cardiac shadow is stable. Aortic calcifications are again seen. Stable bilateral opacities are noted right greater than left consistent with the given clinical history. Old  left humeral fracture with healing is seen. No acute bony abnormality is noted. IMPRESSION: Bilateral opacities consistent with the given clinical history of COVID-19 positivity. No new abnormality is seen. Electronically Signed   By: Inez Catalina M.D.   On: 08/27/2019 11:58   DG Chest Port 1 View  Result Date: 08/21/2019 CLINICAL DATA:  86 year old male with fever, positive COVID-19. EXAM: PORTABLE CHEST 1 VIEW COMPARISON:  Chest radiographs 10/05/2010. FINDINGS: Portable AP semi upright view at 1723 hours. Patchy and confluent opacity in the right mid and lower lung, most pronounced in the lung periphery. Elsewhere when allowing for portable technique the lungs appear clear. Stable lung volumes. No pleural effusion is evident. Normal cardiac size and mediastinal contours. Visualized tracheal air column is within normal limits. Medial right upper lung calcified granuloma. Negative visible bowel gas pattern. No acute osseous abnormality identified. IMPRESSION: Indistinct opacity throughout the right mid and lower lung suspicious for COVID-19 pneumonia in this setting. No pleural effusion. Electronically Signed   By: Genevie Ann M.D.   On: 08/21/2019 17:37   ECHOCARDIOGRAM COMPLETE  Result Date: 08/22/2019    ECHOCARDIOGRAM REPORT   Patient Name:   ATWELL MCDANEL Date of Exam: 08/22/2019 Medical Rec #:  [id number]     Height:       70.0 in Accession #:    [id number]    Weight:       144.2 lb Date of Birth:  [birth year]      BSA:          1.82 m Patient Age:    86 years      BP:           135/102 mmHg Patient Gender: M             HR:           105 bpm. Exam Location:  Inpatient Procedure: 2D Echo Indications:    elevated troponin  History:        Patient has no prior history of Echocardiogram examinations.                 Covid, Arrythmias:Atrial Fibrillation; Risk Factors:Diabetes,                 Hypertension and Dyslipidemia.  Sonographer:    Johny Chess Referring Phys: [place]  1. Septal  apical mid and apical inferior wall akinesis . Left ventricular ejection fraction, by estimation, is 25 to 30%. The left ventricle has severely decreased function. The left ventricle demonstrates regional wall motion abnormalities (see scoring  diagram/findings for description). The left ventricular internal cavity size was moderately dilated. Left ventricular diastolic parameters are indeterminate.  2. Right ventricular systolic function is normal. The right ventricular size is normal. There is moderately elevated pulmonary artery systolic pressure.  3. Left atrial size was moderately dilated.  4. Right atrial size was moderately dilated.  5. The mitral valve is normal in structure and function. Moderate mitral valve regurgitation. No evidence of mitral stenosis.  6. Tricuspid valve regurgitation is moderate.  7. The aortic valve is tricuspid. Aortic valve regurgitation is not visualized. Mild to moderate aortic valve sclerosis/calcification is present, without any evidence of aortic stenosis.  FINDINGS  Left Ventricle: Septal apical mid and apical inferior wall akinesis. Left ventricular ejection fraction, by estimation, is 25 to 30%. The left ventricle has severely decreased function. The left ventricle demonstrates regional wall motion abnormalities.  The left ventricular internal cavity size was moderately dilated. There is no left ventricular hypertrophy. Left ventricular diastolic parameters are indeterminate. Right Ventricle: The right ventricular size is normal. No increase in right ventricular wall thickness. Right ventricular systolic function is normal. There is moderately elevated pulmonary artery systolic pressure. The tricuspid regurgitant velocity is 3.14 m/s, and with an assumed right atrial pressure of 3 mmHg, the estimated right ventricular systolic pressure is 16.1 mmHg. Left Atrium: Left atrial size was moderately dilated. Right Atrium: Right atrial size was moderately dilated. Pericardium: There  is no evidence of pericardial effusion. Mitral Valve: The mitral valve is normal in structure and function. There is mild thickening of the mitral valve leaflet(s). Normal mobility of the mitral valve leaflets. Moderate mitral valve regurgitation. No evidence of mitral valve stenosis. Tricuspid Valve: The tricuspid valve is normal in structure. Tricuspid valve regurgitation is moderate . No evidence of tricuspid stenosis. Aortic Valve: The aortic valve is tricuspid. Aortic valve regurgitation is not visualized. Mild to moderate aortic valve sclerosis/calcification is present, without any evidence of aortic stenosis. Pulmonic Valve: The pulmonic valve was normal in structure. Pulmonic valve regurgitation is not visualized. No evidence of pulmonic stenosis. Aorta: The aortic root is normal in size and structure. IAS/Shunts: No atrial level shunt detected by color flow Doppler.  LEFT VENTRICLE PLAX 2D LVIDd:         4.35 cm LVIDs:         3.62 cm LV PW:         1.12 cm LV IVS:        0.96 cm LVOT diam:     2.00 cm LV SV Index:   16.86 LVOT Area:     3.14 cm  LEFT ATRIUM             Index       RIGHT ATRIUM           Index LA diam:        4.60 cm 2.53 cm/m  RA Area:     18.50 cm LA Vol (A2C):   73.5 ml 40.47 ml/m RA Volume:   54.40 ml  29.95 ml/m LA Vol (A4C):   59.4 ml 32.70 ml/m LA Biplane Vol: 66.6 ml 36.67 ml/m   AORTA Ao Root diam: 3.10 cm Ao Asc diam:  3.10 cm TRICUSPID VALVE TR Peak grad:   39.4 mmHg TR Vmax:        314.00 cm/s  SHUNTS Systemic Diam: 2.00 cm Jenkins Rouge MD Electronically signed by Jenkins Rouge MD Signature Date/Time: 08/22/2019/9:01:52 AM    Final     Subjective: Seen this morning 2/22, lethargic, weak. OK to discharge to residential hospice  Discharge Exam: BP (!) 154/60 (BP Location: Right Arm)   Pulse 61   Temp (!) 97.1 F (36.2 C) (Axillary)   Resp 20   Ht 5' 10" (1.778 m)   Wt 75 kg   SpO2 93%   BMI 23.72 kg/m   General: lethargic Cardiovascular: rrr, no edema    Respiratory: shallow respirations, no wheezing Abdominal: Soft, bowel sounds + Extremities: no cyanosis   The results of significant diagnostics from this hospitalization (including imaging, microbiology, ancillary and laboratory) are listed below for reference.     Microbiology: No results  found for this or any previous visit (from the past 240 hour(s)).   Labs: Basic Metabolic Panel: Recent Labs  Lab 08/26/19 0334 08/27/19 0220 08/28/19 0300 08/29/19 0125 08/30/19 0233  NA 137 136 137 141 144  K 3.2* 3.3* 3.8 3.8 4.0  CL 105 105 103 107 110  CO2 16* 17* 18* 20* 17*  GLUCOSE 115* 59* 159* 164* 170*  BUN 147* 150* 157* 157* 171*  CREATININE 4.01* 4.31* 4.46* 4.87* 4.89*  CALCIUM 8.1* 8.1* 8.1* 8.1* 7.9*   Liver Function Tests: Recent Labs  Lab 08/26/19 0334  AST 26  ALT 26  ALKPHOS 34*  BILITOT 0.7  PROT 5.6*  ALBUMIN 2.8*   CBC: Recent Labs  Lab 08/26/19 0334 08/28/19 0300  WBC 13.2* 16.5*  NEUTROABS 11.5*  --   HGB 13.0 12.7*  HCT 37.9* 37.0*  MCV 88.8 88.3  PLT 289 354   CBG: Recent Labs  Lab 08/31/19 0711 08/31/19 1155 08/31/19 1541 08/31/19 2000 09/01/19 0806  GLUCAP 99 155* 113* 173* 206*   Hgb A1c No results for input(s): HGBA1C in the last 72 hours. Lipid Profile No results for input(s): CHOL, HDL, LDLCALC, TRIG, CHOLHDL, LDLDIRECT in the last 72 hours. Thyroid function studies No results for input(s): TSH, T4TOTAL, T3FREE, THYROIDAB in the last 72 hours.  Invalid input(s): FREET3 Urinalysis    Component Value Date/Time   COLORURINE YELLOW 08/21/2019 2230   APPEARANCEUR HAZY (A) 08/21/2019 2230   LABSPEC 1.014 08/21/2019 2230   PHURINE 5.0 08/21/2019 2230   GLUCOSEU NEGATIVE 08/21/2019 2230   HGBUR SMALL (A) 08/21/2019 2230   BILIRUBINUR NEGATIVE 08/21/2019 2230   KETONESUR NEGATIVE 08/21/2019 2230   PROTEINUR 100 (A) 08/21/2019 2230   UROBILINOGEN 0.2 10/07/2010 1715   NITRITE NEGATIVE 08/21/2019 2230   LEUKOCYTESUR TRACE  (A) 08/21/2019 2230    FURTHER DISCHARGE INSTRUCTIONS:   Get Medicines reviewed and adjusted: Please take all your medications with you for your next visit with your Primary MD   Laboratory/radiological data: Please request your Primary MD to go over all hospital tests and procedure/radiological results at the follow up, please ask your Primary MD to get all Hospital records sent to his/her office.   In some cases, they will be blood work, cultures and biopsy results pending at the time of your discharge. Please request that your primary care M.D. goes through all the records of your hospital data and follows up on these results.   Also Note the following: If you experience worsening of your admission symptoms, develop shortness of breath, life threatening emergency, suicidal or homicidal thoughts you must seek medical attention immediately by calling 911 or calling your MD immediately  if symptoms less severe.   You must read complete instructions/literature along with all the possible adverse reactions/side effects for all the Medicines you take and that have been prescribed to you. Take any new Medicines after you have completely understood and accpet all the possible adverse reactions/side effects.    Do not drive when taking Pain medications or sleeping medications (Benzodaizepines)   Do not take more than prescribed Pain, Sleep and Anxiety Medications. It is not advisable to combine anxiety,sleep and pain medications without talking with your primary care practitioner   Special Instructions: If you have smoked or chewed Tobacco  in the last 2 yrs please stop smoking, stop any regular Alcohol  and or any Recreational drug use.   Wear Seat belts while driving.   Please note: You were cared for by  a hospitalist during your hospital stay. Once you are discharged, your primary care physician will handle any further medical issues. Please note that NO REFILLS for any discharge medications  will be authorized once you are discharged, as it is imperative that you return to your primary care physician (or establish a relationship with a primary care physician if you do not have one) for your post hospital discharge needs so that they can reassess your need for medications and monitor your lab values.  Time coordinating discharge: 35 minutes  SIGNED:  Marzetta Board, MD, PhD 09/01/2019, 9:15 AM

## 2019-08-31 NOTE — Progress Notes (Signed)
Pt wedding ring was removed and given to son Magda Paganini and daughter in-law today due to palliative care.

## 2019-08-31 NOTE — Progress Notes (Signed)
[facility])  Referral received for residential hospice at one of our inpatient facilities.  Will determine eligibility.   Spoke to family, preference is for [facility], answered questions and provided support.  Will update TOC manager once eligibility has been confirmed.  Venia Carbon RN, BSN, [facility] Liaison (in [place]) [phone]

## 2019-09-01 DIAGNOSIS — I5021 Acute systolic (congestive) heart failure: Secondary | ICD-10-CM

## 2019-09-01 LAB — GLUCOSE, CAPILLARY
Glucose-Capillary: 190 mg/dL — ABNORMAL HIGH (ref 70–99)
Glucose-Capillary: 282 mg/dL — ABNORMAL HIGH (ref 70–99)
Glucose-Capillary: 206 mg/dL — ABNORMAL HIGH (ref 70–99)
Glucose-Capillary: 148 mg/dL — ABNORMAL HIGH (ref 70–99)

## 2019-09-01 NOTE — Consult Note (Signed)
   [facility] Inpatient Consult   09/01/2019  KHUSH PASION January 15, 1921 [id number]   Screened for disposition Medicare ACO:  Chart briefly reviewed for Medicare NextGen ACO in the [facility]  for length of stay and follow up needs. Review of the inpatient Transition Of Care [TOC] team members reveals that the patient is for a [place]. Given this choice patient will have full case management services through Hospice and this writer will sign off at disposition.  Patient transitioned to [facility] per notes. No [facility] Care Management follow up.   Natividad Brood, RN BSN [facility] Liaison  [phone] business mobile phone Toll free office [phone]  Fax number: [phone] [email or web address]

## 2019-09-01 NOTE — TOC Progression Note (Signed)
Transition of Care [facility]) - Progression Note    Patient Details  Name: Nicolas Snyder MRN: [id number] Date of Birth: 07-07-1921  Transition of Care [facility]) CM/SW Contact  Joaquin Courts, RN Phone Number: 09/01/2019, 12:28 PM  Clinical Narrative:    PTAR transport arranged.    Expected Discharge Plan: [place] Barriers to Discharge: No Barriers Identified  Expected Discharge Plan and Services Expected Discharge Plan: [place] In-house Referral: Clinical Social Work   Post Acute Care Choice: [place] Living arrangements for the past 2 months: [place] Expected Discharge Date: 09/01/19                                     Social Determinants of Health (SDOH) Interventions    Readmission Risk Interventions No flowsheet data found.

## 2019-09-01 NOTE — Care Management Important Message (Signed)
Important Message  Patient Details  Name: [name] MRN: [id number] Date of Birth: [birth year]   Medicare Important Message Given:  Yes - Important Message mailed due to current National Emergency  Verbal consent obtained due to current National Emergency  Relationship to patient: Child Contact Name: Assad Harbeson Call Date: 09/01/19  Time: 1127 Phone: [phone] Outcome: Spoke with contact Important Message mailed to: Emergency contact on file    Delorse Lek 09/01/2019, 11:28 AM

## 2019-09-01 NOTE — Progress Notes (Signed)
Physical Therapy Treatment Patient Details Name: Nicolas Snyder MRN: [id number] DOB: 01-30-1921 Today's Date: 09/01/2019    History of Present Illness Patient was admitted to the hospital with a working diagnosis of acute hypoxic respiratory failure due to SARS COVID-19 viral pneumonia complicated by acute on chronic kidney injury/to rule out bacterial sepsis. 86 y/o pt w/ past medical history for dyslipidemia, hypertension, type II times mellitus, paroxysmal atrial fibrillation, hypothyroidism and chronic kidney disease stage III.  He lives at a facility "[facility]".    PT Comments    Pt demonstrates continued progress with participation/sitting balance/and transfer ability. He continues to require MaxA for bed mobility, but does attempt to move LE and maintain rigidity to help with sit>supine transfers. He continues to verbalize agreement to standing, and was able to stand longer today (1 min) with 1 instance of near R knee buckling, but was able to self correct. Pt planned on discharging to hospice today and will require no further PT follow up at this time.  PT signing off at this time.  Follow Up Recommendations  No PT follow up;Other (comment)(Due to hospice care)     Equipment Recommendations  None recommended by PT    Recommendations for Other Services       Precautions / Restrictions Precautions Precautions: Fall;Other (comment) Precaution Comments: Airborne (COVID), Impulsive (from prior sessions) Restrictions Weight Bearing Restrictions: No    Mobility  Bed Mobility Overal bed mobility: Needs Assistance Bed Mobility: Sit to Supine     Supine to sit: Max assist Sit to supine: Max assist   General bed mobility comments: Pt require multiple TC/VCs for concentration and participation in session. Able to sit EOB modI with RUE primary support but able to shift weight to other UE safely.  Transfers Overall transfer level: Needs assistance Equipment used: Rolling walker (2  wheeled) Transfers: Sit to/from Stand Sit to Stand: Max assist         General transfer comment: Pt able to assist with standing and remain standing, requiring MaxA to assit with initial stand. Able to maintain approximately 30 seconds before sitting back down. Would not attempt to transfer to chair without stedy at this time.         Balance Overall balance assessment: Needs assistance Sitting-balance support: Feet supported;Bilateral upper extremity supported Sitting balance-Leahy Scale: Fair Sitting balance - Comments: Pt able to maintain balance on EOB for majority of session >15 min while performing EOB LE exercises with mod-maxA for participation. Postural control: Right lateral lean Standing balance support: Bilateral upper extremity supported;During functional activity Standing balance-Leahy Scale: Poor Standing balance comment: some retropulsion noted with standing, pt demonstrated R knee buckling but was able to self correct with standing            Cognition Arousal/Alertness: Lethargic Behavior During Therapy: Flat affect Overall Cognitive Status: No family/caregiver present to determine baseline cognitive functioning           General Comments: pt barely keeping eyes open during session needing stern cues and encouragement to complete tasks given          Pertinent Vitals/Pain Faces Pain Scale: No hurt           PT Goals (current goals can now be found in the care plan section) Acute Rehab PT Goals Patient Stated Goal: did not voice goals PT Goal Formulation: Patient unable to participate in goal setting Time For Goal Achievement: 09/09/19 Potential to Achieve Goals: Fair Progress towards PT goals: Progressing toward goals  Frequency    Min 2X/week      PT Plan Discharge plan needs to be updated       AM-PAC PT "6 Clicks" Mobility   Outcome Measure  Help needed turning from your back to your side while in a flat bed without using  bedrails?: A Lot Help needed moving from lying on your back to sitting on the side of a flat bed without using bedrails?: A Lot Help needed moving to and from a bed to a chair (including a wheelchair)?: A Lot Help needed standing up from a chair using your arms (e.g., wheelchair or bedside chair)?: A Lot Help needed to walk in hospital room?: A Lot Help needed climbing 3-5 steps with a railing? : Total 6 Click Score: 11    End of Session Equipment Utilized During Treatment: Gait belt Activity Tolerance: Patient limited by lethargy;Patient limited by fatigue Patient left: in bed;with call bell/phone within reach;with bed alarm set Nurse Communication: Mobility status PT Visit Diagnosis: Unsteadiness on feet (R26.81);Other abnormalities of gait and mobility (R26.89)     Time: 7972-8206 PT Time Calculation (min) (ACUTE ONLY): 18 min  Charges:  $Therapeutic Activity: 8-22 mins                     Ann Held PT, DPT Acute Rehab [place]: [phone]    Ineze Serrao A Dajai Wahlert 09/01/2019, 11:31 AM

## 2019-09-01 NOTE — Progress Notes (Signed)
Engineer, maintenance [facility]) Hospital Liaison note.    Paperwork completed and [employer] is ready for patient to transfer.     RN please call report to [phone]. Please arrange transport for patient.    Thank you,    Farrel Gordon, RN, CCM      [place] (listed on [facility] under Hospice and [place] [facility] [place])    [phone]

## 2019-09-01 NOTE — TOC Progression Note (Signed)
Transition of Care [facility]) - Progression Note    Patient Details  Name: TONNIE FRIEDEL MRN: [id number] Date of Birth: 08-Jun-1921  Transition of Care [facility]) CM/SW [place], [place] Phone Number: 09/01/2019, 10:21 AM  Clinical Narrative:   CSW following, spoke with Naranja multiple times today to ensure referral information was received and answer questions. Dan with Hospice indicated that patient was 10 days post-COVID, so he may not need isolation anymore according to their regulations. Dan later confirmed that patient no longer needs COVID, and CSW indicated that family would prefer [facility] if possible. Dan sent referral to [facility], and they will review. CSW to follow.    Expected Discharge Plan: [place] Barriers to Discharge: Hospice Bed not available  Expected Discharge Plan and Services Expected Discharge Plan: [place] In-house Referral: Clinical Social Work   Post Acute Care Choice: [place] Living arrangements for the past 2 months: [place] Expected Discharge Date: 09/01/19                                     Social Determinants of Health (SDOH) Interventions    Readmission Risk Interventions No flowsheet data found.

## 2019-09-08 DEATH — deceased

## 2021-03-11 IMAGING — DX DG CHEST 1V PORT
1 series · 2 of 2 positions shown · non-contrast
Comparison: Chest radiographs 10/05/2010.

CLINICAL DATA: [AGE] male with fever, positive NO7S0-K7.

EXAM:
PORTABLE CHEST 1 VIEW

[Series 1: chest · 0.14mm/px · 2 of 2 slices shown]
[im 1/2]
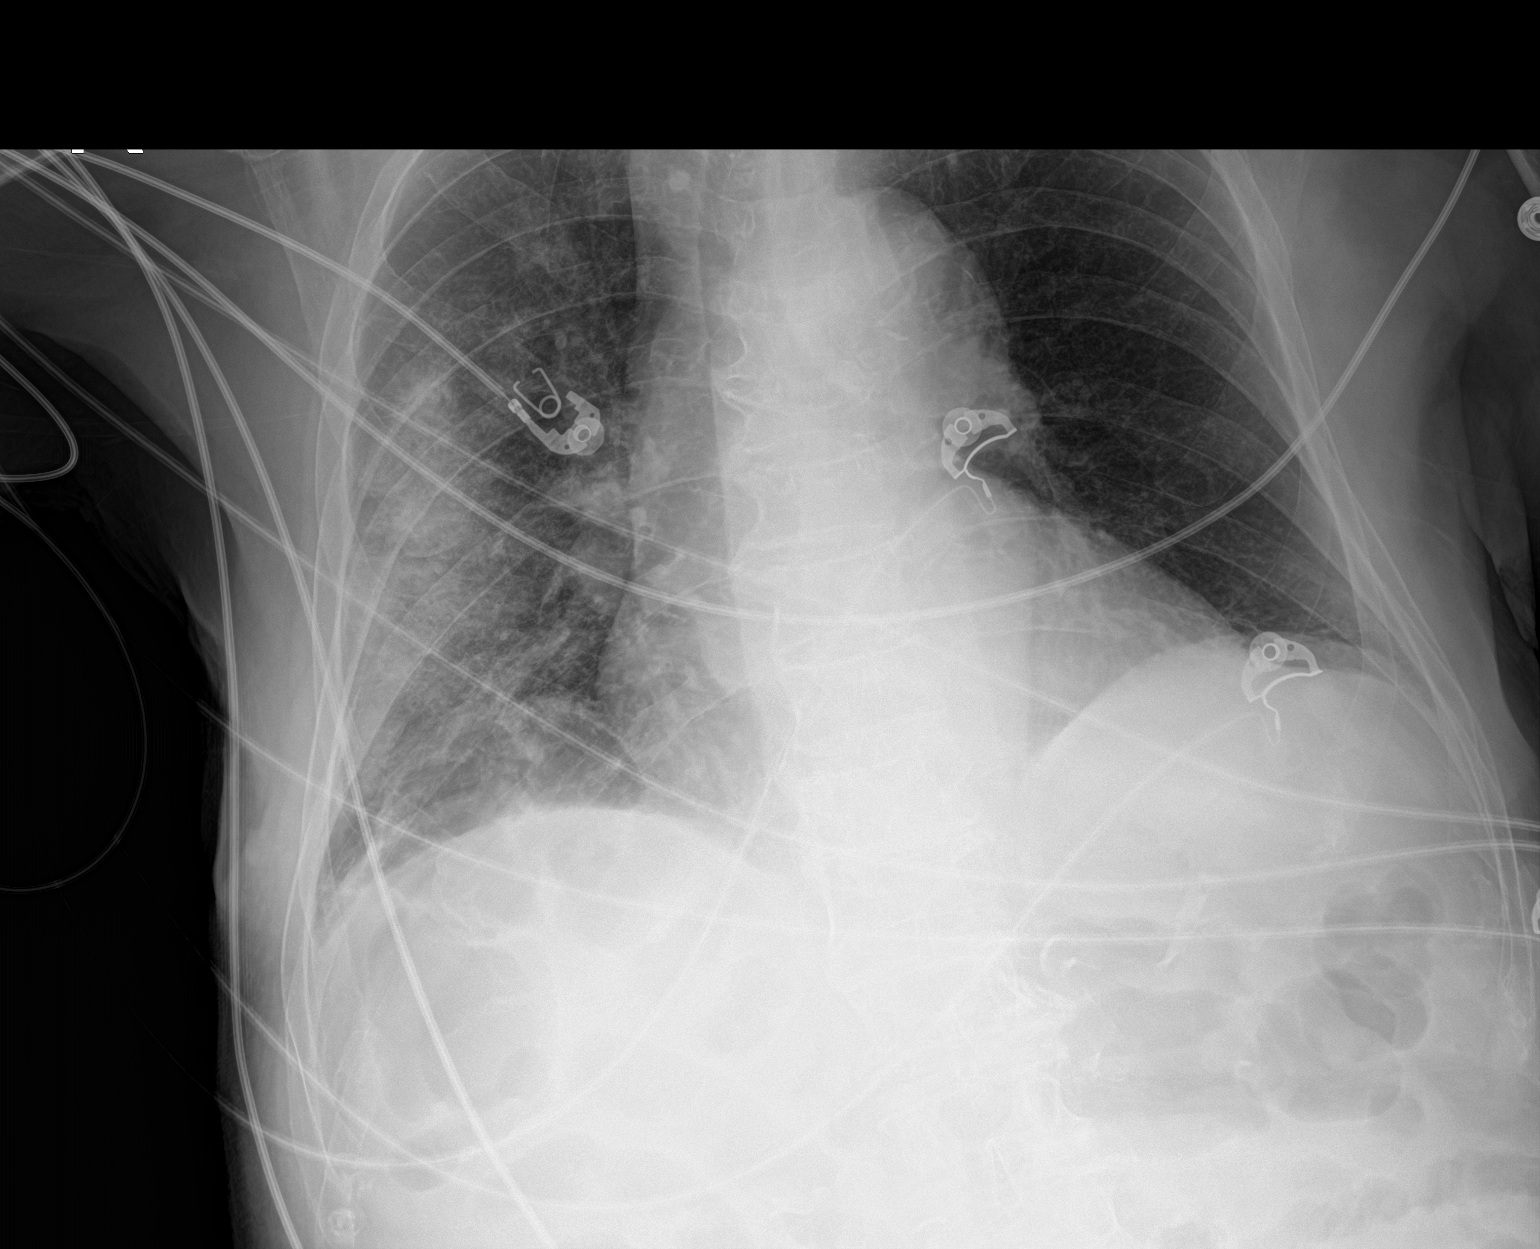
[im 2/2]
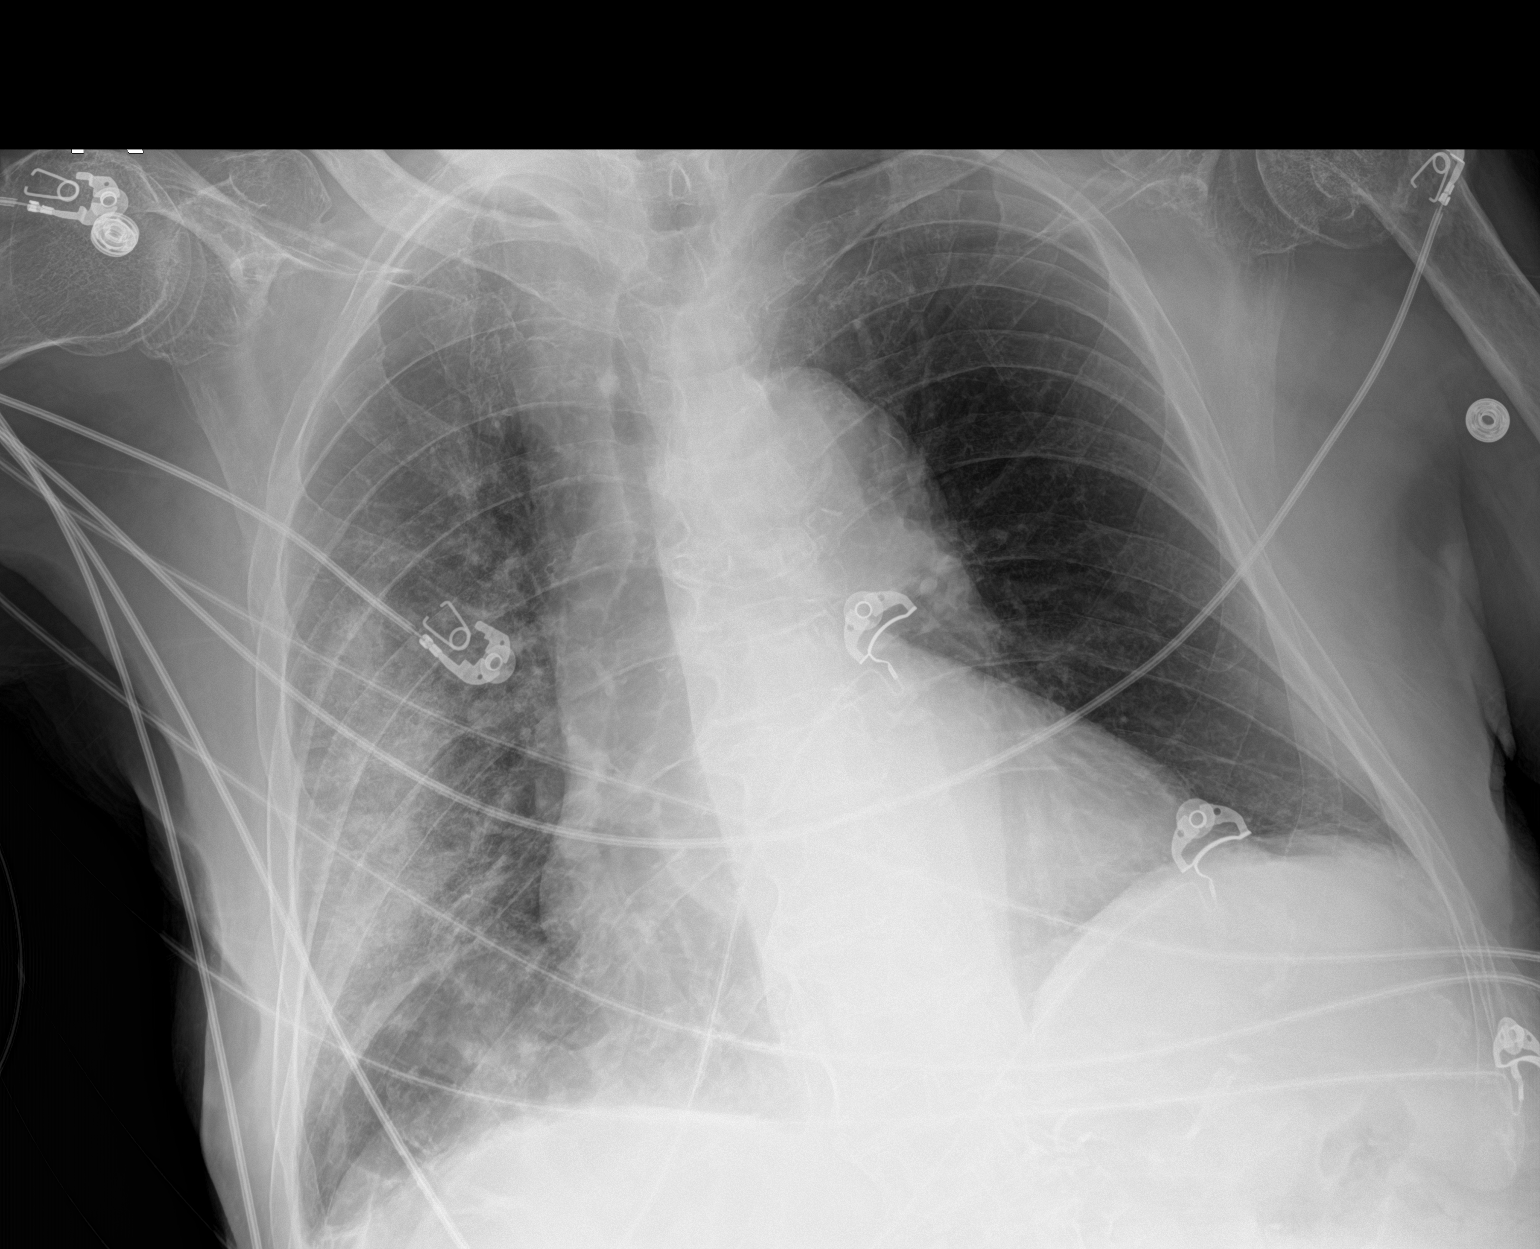

[2 of 2 positions shown; findings below may reference images not displayed]

FINDINGS: Portable AP semi upright view at 8263 hours. Patchy and confluent
opacity in the right mid and lower lung, most pronounced in the lung
periphery. Elsewhere when allowing for portable technique the lungs
appear clear. Stable lung volumes. No pleural effusion is evident.

Normal cardiac size and mediastinal contours. Visualized tracheal
air column is within normal limits. Medial right upper lung
calcified granuloma. Negative visible bowel gas pattern. No acute
osseous abnormality identified.
IMPRESSION: Indistinct opacity throughout the right mid and lower lung
suspicious for NO7S0-K7 pneumonia in this setting. No pleural
effusion.

## 2021-03-11 IMAGING — US US ABDOMEN COMPLETE
1 series · 13 of 25 positions shown · non-contrast
Comparison: 10/05/2010

CLINICAL DATA: Acute renal failure, abnormal liver function tests,
[id number] positive

EXAM:
ABDOMEN ULTRASOUND COMPLETE

[Series 1: us abdomen complete · 13 of 78 slices shown]
[im 1/78]
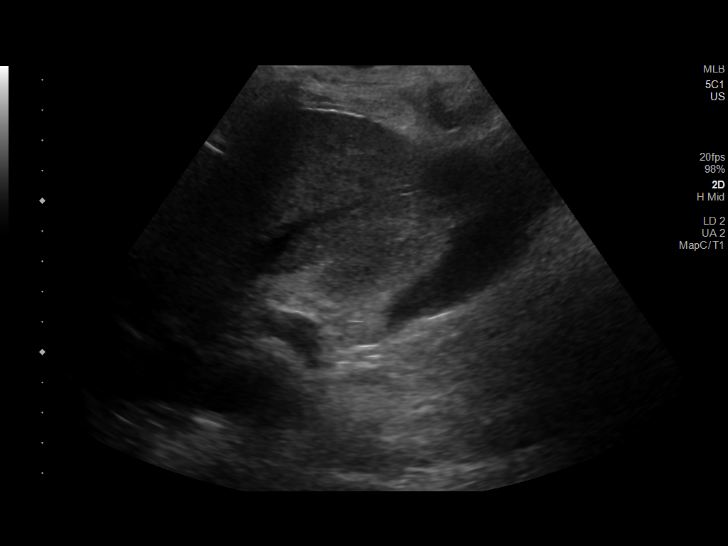
[im 7/78]
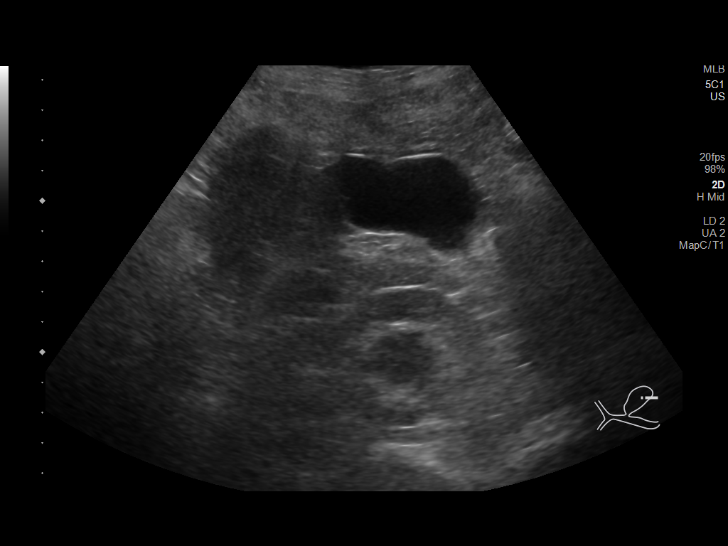
[im 13/78]
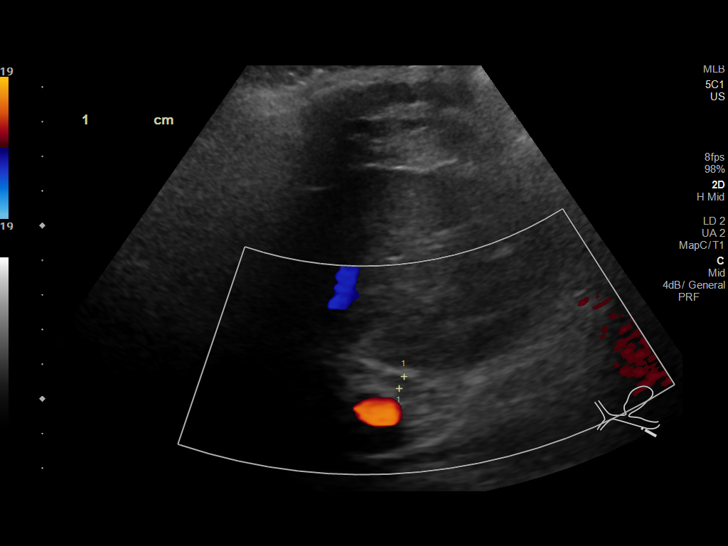
[im 20/78]
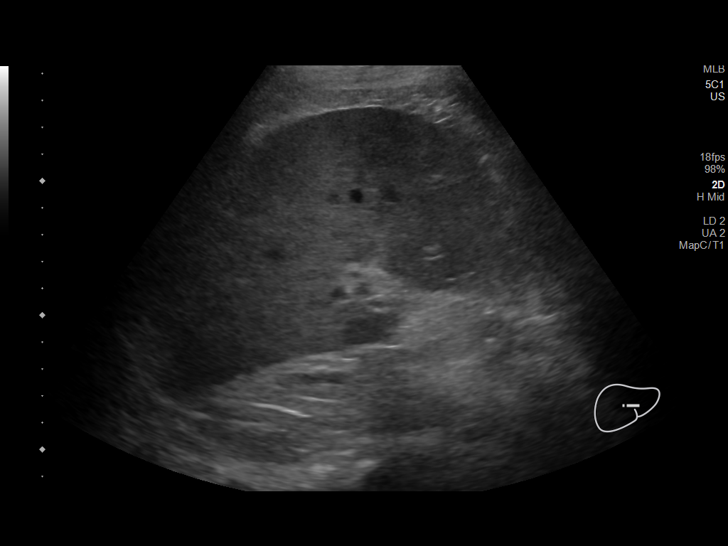
[im 26/78]
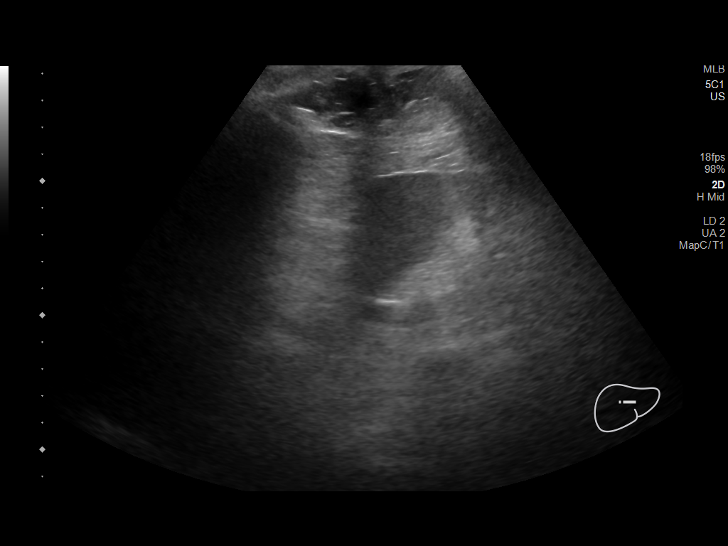
[im 33/78]
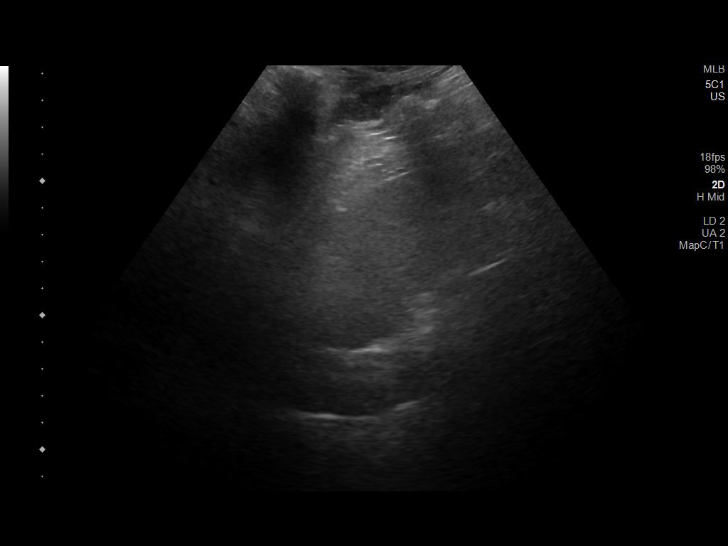
[im 39/78]
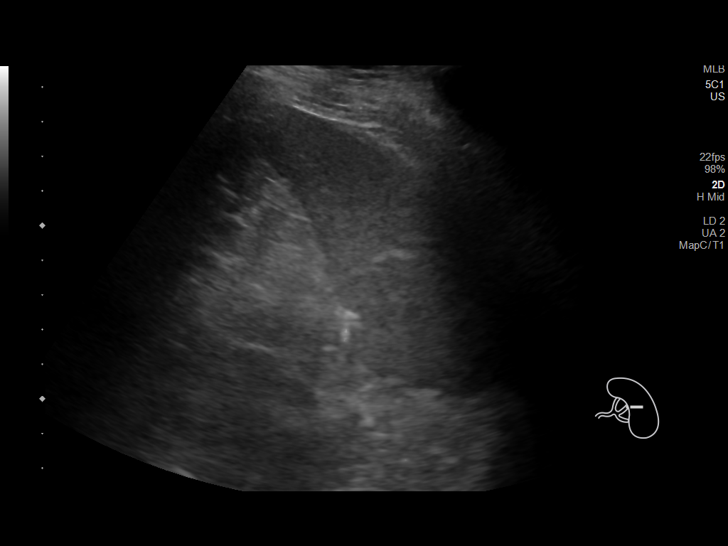
[im 45/78]
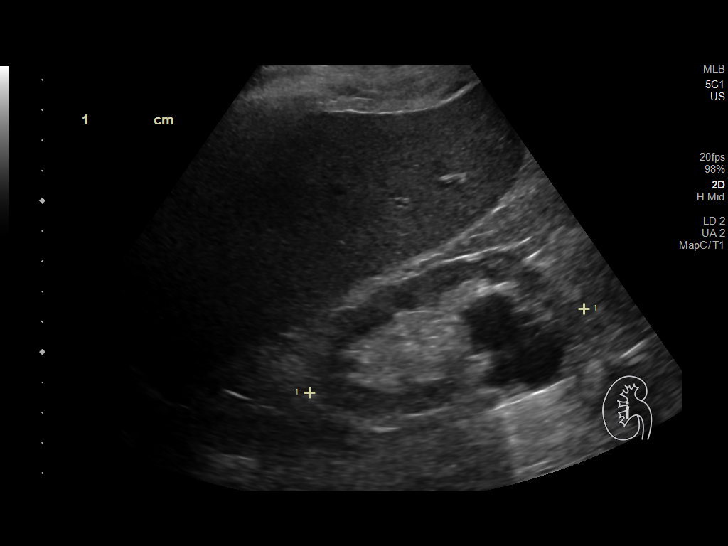
[im 52/78]
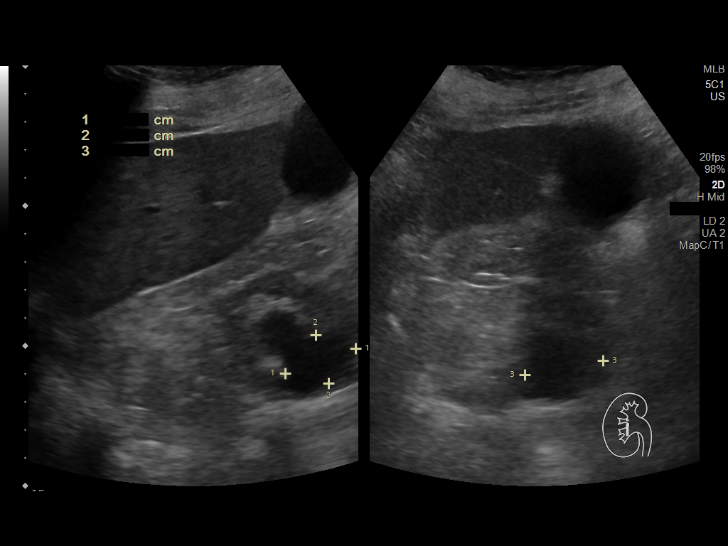
[im 58/78]
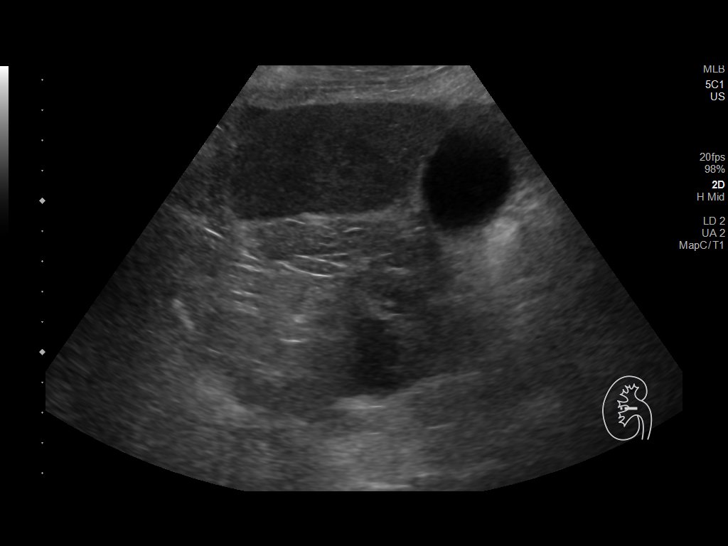
[im 65/78]
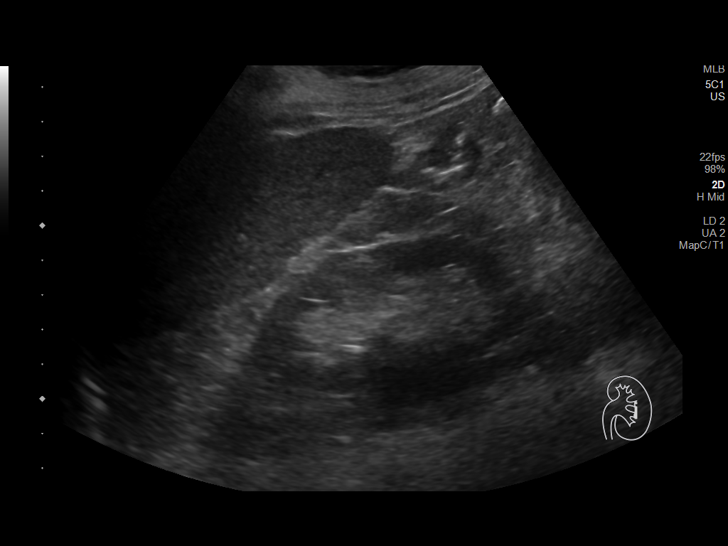
[im 71/78]
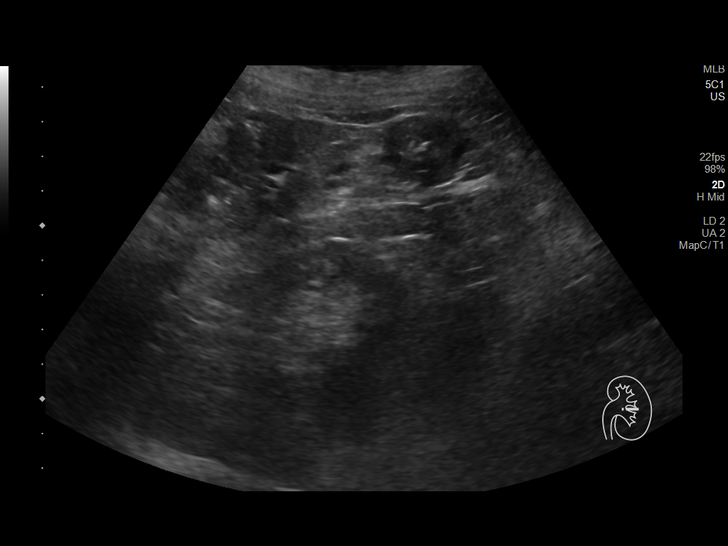
[im 78/78]
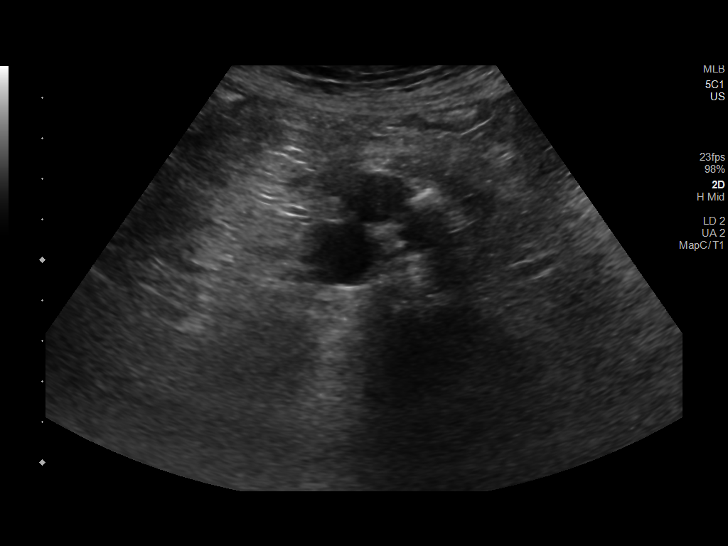

[13 of 25 positions shown; findings below may reference images not displayed]

FINDINGS: Gallbladder: No gallstones or wall thickening visualized. No
sonographic Murphy sign noted by sonographer.

Common bile duct: Diameter: 4 mm

Liver: No focal lesion identified. Within normal limits in
parenchymal echogenicity. Portal vein is patent on color Doppler
imaging with normal direction of blood flow towards the liver.

IVC: No abnormality visualized.

Pancreas: Obscured due to bowel gas.

Spleen: Size and appearance within normal limits.

Right Kidney: Length: 9.5. There is right renal cortical thinning.
Echotexture is normal. Multiple peripelvic and cortical cysts are
identified, largest in the lower pole measuring 2.8 cm.

Left Kidney: Length: 8.7. There is left renal cortical thinning.
Echotexture is normal. No masses.

Abdominal aorta: The infrarenal abdominal aorta measures 3.3 cm in
maximal anterior-posterior dimension in the sagittal plane.

Other findings: No free fluid. Incidental small right pleural
effusion.
IMPRESSION: 1. Bilateral renal cortical atrophy.
2. 3.3 cm infrarenal abdominal aortic aneurysm.
3. Trace right pleural effusion.
